# Patient Record
Sex: Female | Born: 1965 | Race: White | Hispanic: No | Smoking: Current every day smoker
Health system: Southern US, Community
[De-identification: ages and names within clinical notes are randomized; demographics above are authoritative.]

## PROBLEM LIST (undated history)

## (undated) DIAGNOSIS — G8929 Other chronic pain: Secondary | ICD-10-CM

## (undated) DIAGNOSIS — J449 Chronic obstructive pulmonary disease, unspecified: Secondary | ICD-10-CM

## (undated) DIAGNOSIS — R569 Unspecified convulsions: Secondary | ICD-10-CM

## (undated) DIAGNOSIS — D649 Anemia, unspecified: Secondary | ICD-10-CM

## (undated) DIAGNOSIS — E039 Hypothyroidism, unspecified: Secondary | ICD-10-CM

## (undated) DIAGNOSIS — R51 Headache: Secondary | ICD-10-CM

## (undated) HISTORY — DX: Unspecified convulsions: R56.9

## (undated) HISTORY — DX: Hypothyroidism, unspecified: E03.9

## (undated) HISTORY — DX: Chronic obstructive pulmonary disease, unspecified: J44.9

## (undated) HISTORY — DX: Other chronic pain: G89.29

## (undated) HISTORY — DX: Anemia, unspecified: D64.9

## (undated) HISTORY — PX: NASAL SINUS SURGERY: SHX719

## (undated) HISTORY — DX: Headache: R51

---

## 2006-02-22 ENCOUNTER — Emergency Department: Payer: Self-pay | Admitting: Unknown Physician Specialty

## 2006-02-23 ENCOUNTER — Emergency Department: Payer: Self-pay | Admitting: Emergency Medicine

## 2006-08-04 ENCOUNTER — Ambulatory Visit: Payer: Self-pay | Admitting: Pain Medicine

## 2006-08-24 ENCOUNTER — Emergency Department: Payer: Self-pay | Admitting: Emergency Medicine

## 2006-10-15 ENCOUNTER — Other Ambulatory Visit: Payer: Self-pay

## 2006-10-15 ENCOUNTER — Inpatient Hospital Stay: Payer: Self-pay | Admitting: Internal Medicine

## 2006-10-26 ENCOUNTER — Emergency Department: Payer: Self-pay | Admitting: Emergency Medicine

## 2007-09-12 ENCOUNTER — Emergency Department: Payer: Self-pay | Admitting: Emergency Medicine

## 2008-10-04 ENCOUNTER — Emergency Department: Payer: Self-pay | Admitting: Emergency Medicine

## 2009-09-05 ENCOUNTER — Emergency Department: Payer: Self-pay | Admitting: Emergency Medicine

## 2009-11-11 ENCOUNTER — Inpatient Hospital Stay: Payer: Self-pay | Admitting: Internal Medicine

## 2009-11-28 ENCOUNTER — Inpatient Hospital Stay: Payer: Self-pay | Admitting: Student

## 2010-01-09 ENCOUNTER — Emergency Department: Payer: Self-pay | Admitting: Emergency Medicine

## 2010-01-14 ENCOUNTER — Inpatient Hospital Stay: Payer: Self-pay | Admitting: Surgery

## 2010-05-19 ENCOUNTER — Emergency Department: Payer: Self-pay | Admitting: Internal Medicine

## 2010-07-21 ENCOUNTER — Inpatient Hospital Stay: Payer: Self-pay | Admitting: Internal Medicine

## 2010-08-06 ENCOUNTER — Inpatient Hospital Stay: Payer: Self-pay | Admitting: Psychiatry

## 2011-10-28 IMAGING — CR DG CHEST 1V PORT
1 series · 1 of 1 positions shown · non-contrast
Comparison: none

REASON FOR EXAM: weakness
COMMENTS:

PROCEDURE:     DXR - DXR PORTABLE CHEST SINGLE VIEW  - November 28, 2009  [DATE]
RESULT:     Comparison is made to the prior exam of 11/11/2009. The lung
fields are clear. The heart, mediastinal and osseous structures show no
acute changes.

[view not recorded]
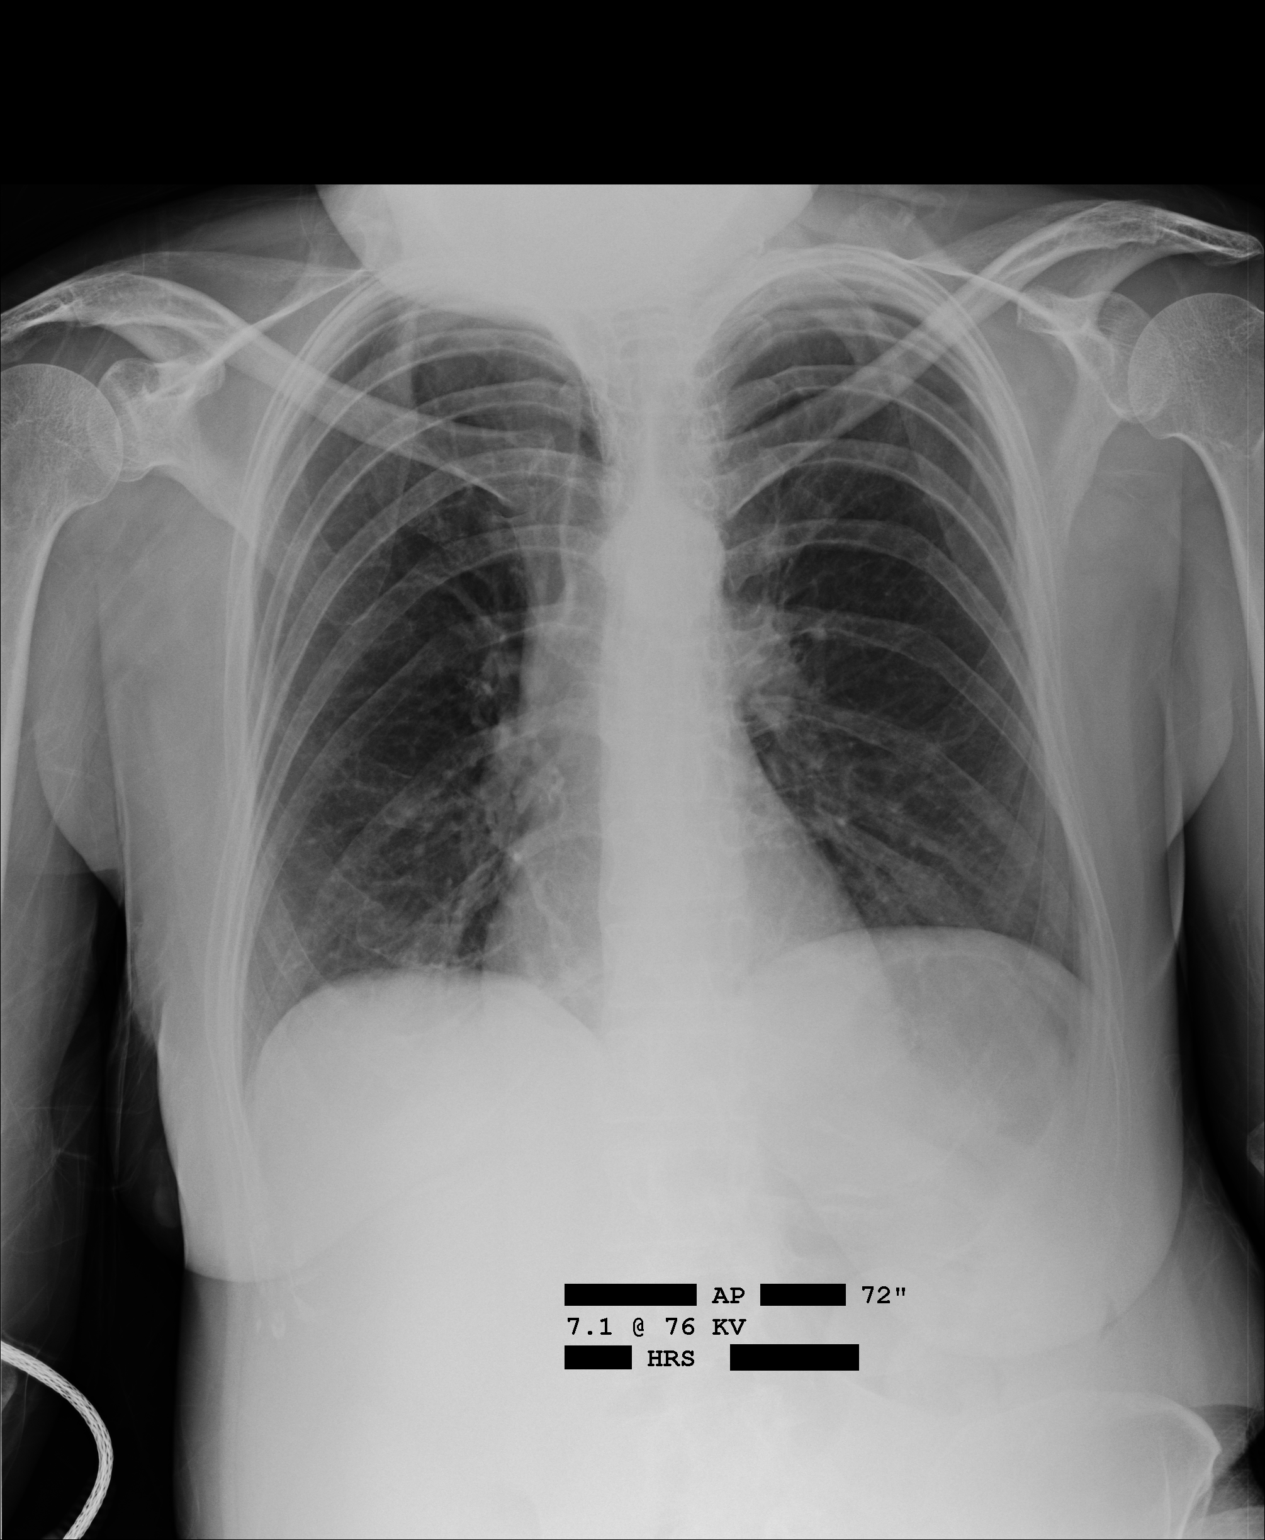

[1 of 1 positions shown; findings below may reference images not displayed]

IMPRESSION: 1.     No acute changes are identified.

## 2011-10-28 IMAGING — US ABDOMEN ULTRASOUND
1 series · 17 of 25 positions shown · non-contrast
Comparison: none

REASON FOR EXAM: elevated lfts
COMMENTS:

[Series 1: abdomen ultrasound · 17 of 55 slices shown]
[im 1/55]
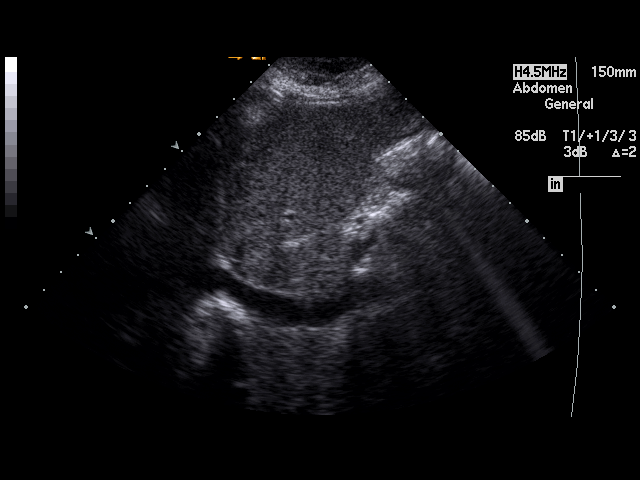
[im 5/55]
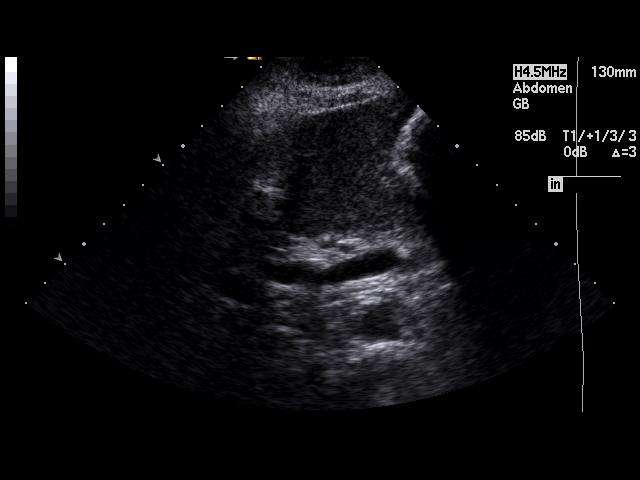
[im 7/55]
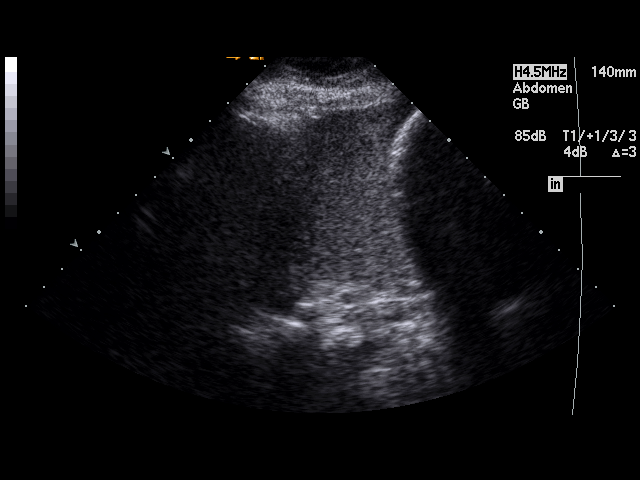
[im 12/55]
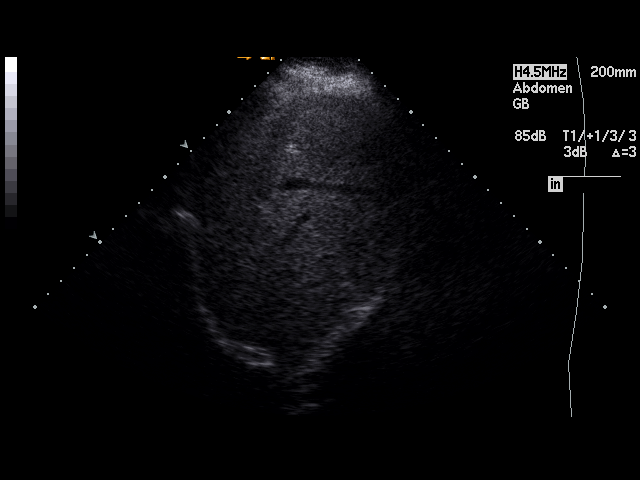
[im 14/55]
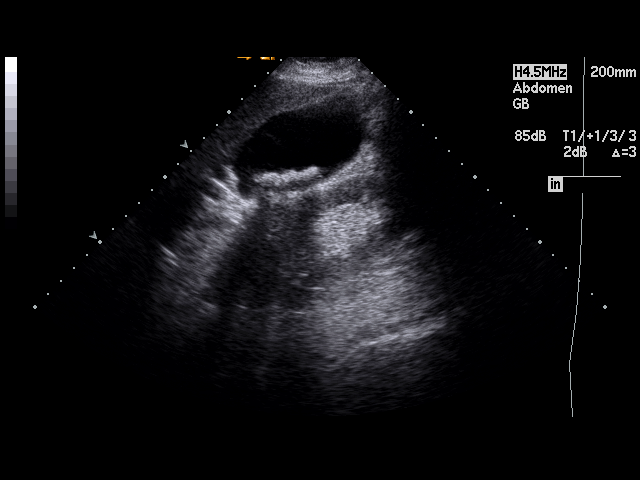
[im 19/55]
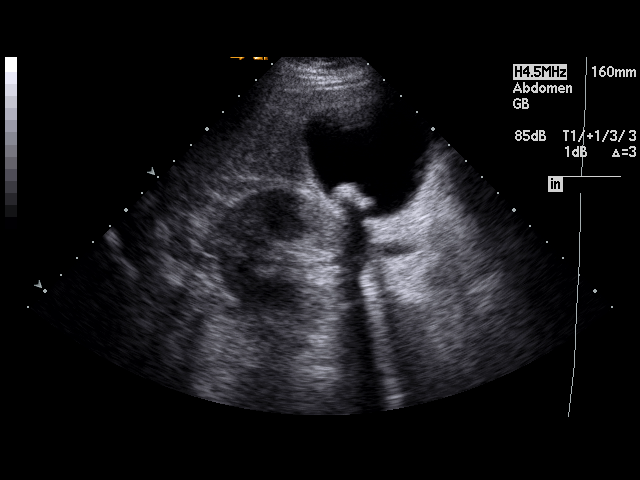
[im 21/55]
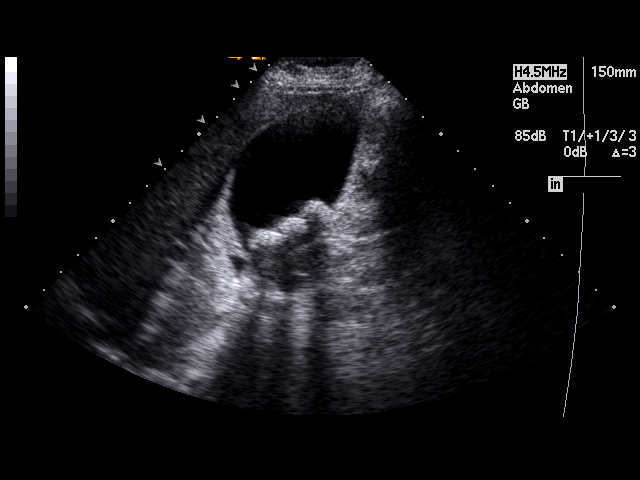
[im 25/55]
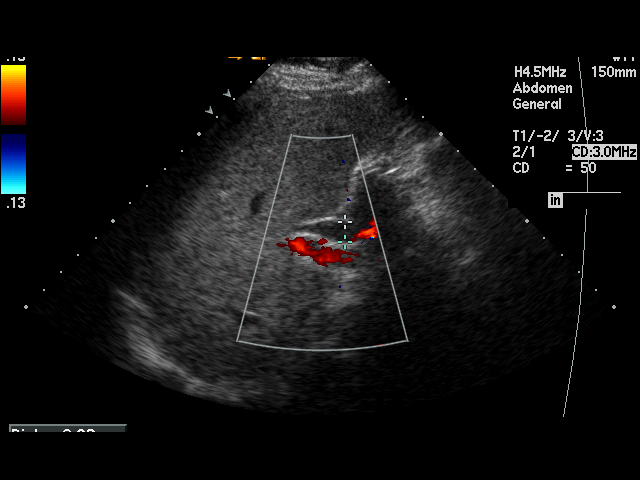
[im 28/55]
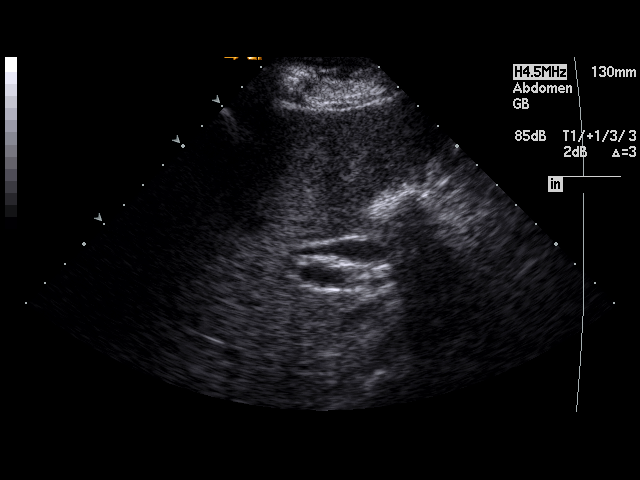
[im 30/55]
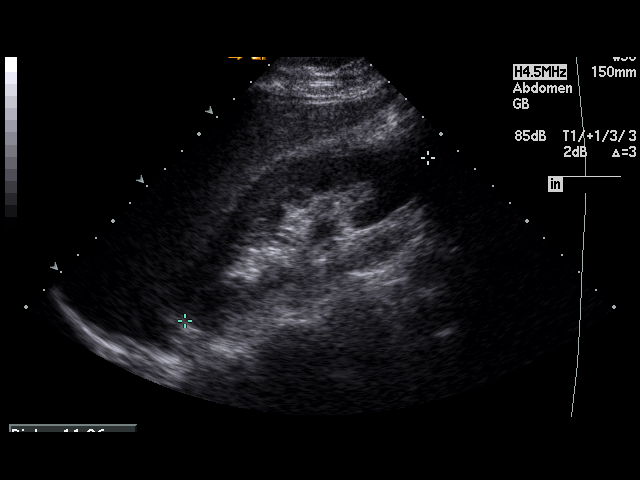
[im 34/55]
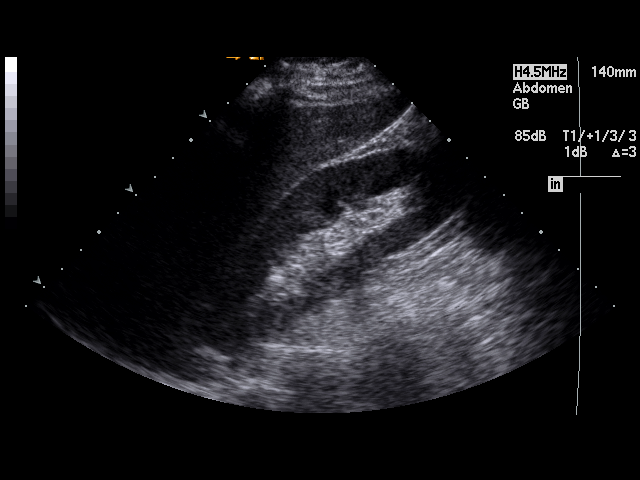
[im 37/55]
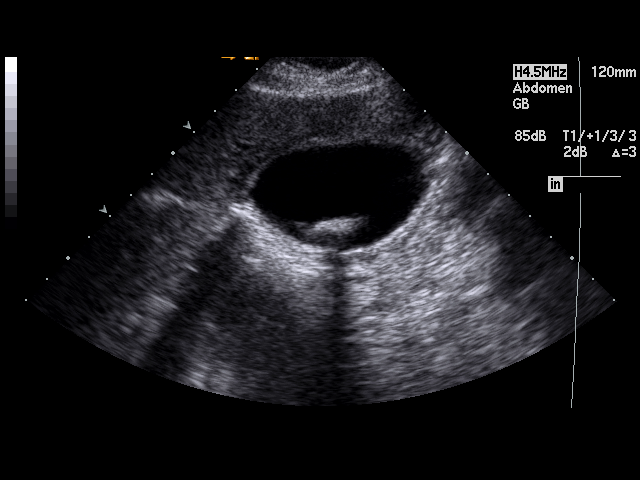
[im 41/55]
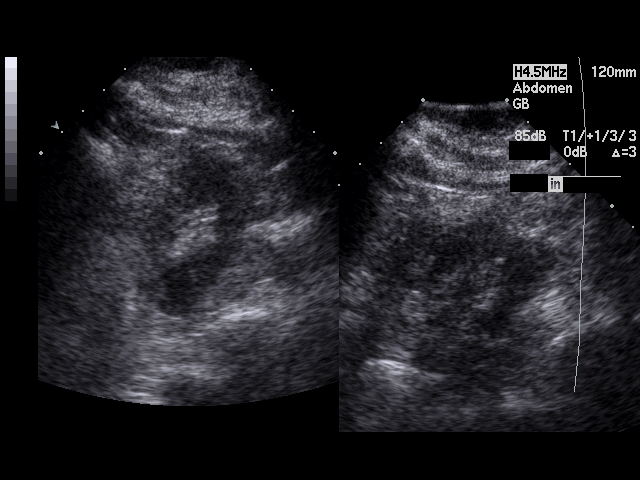
[im 43/55]
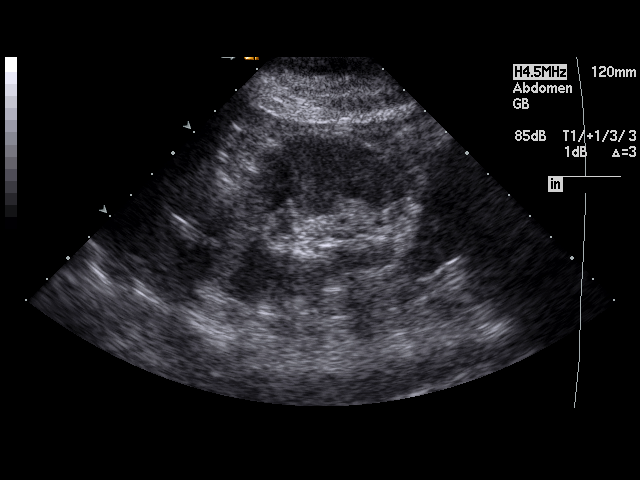
[im 48/55]
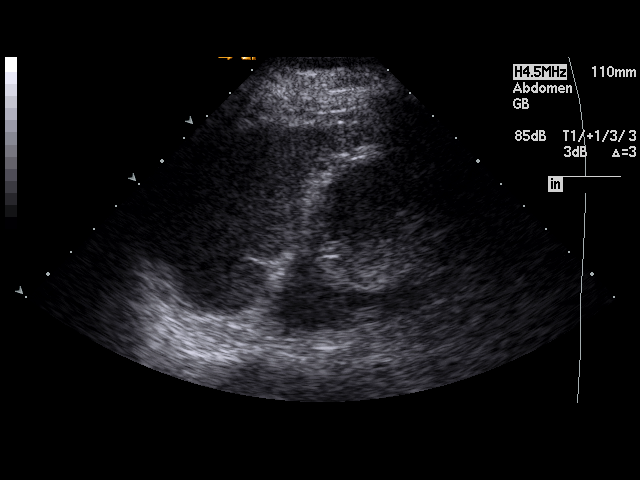
[im 50/55]
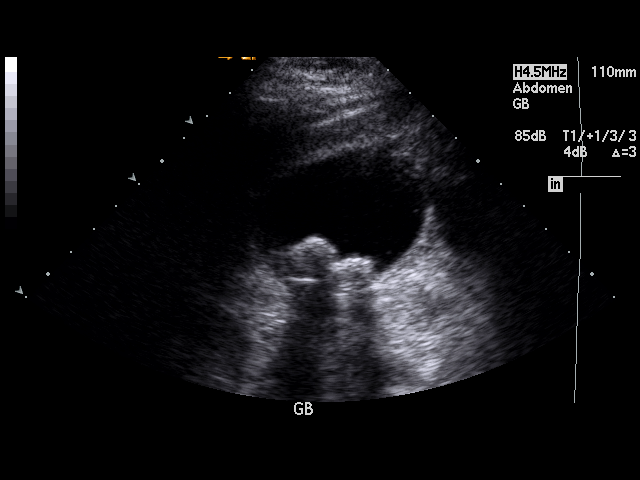
[im 55/55]
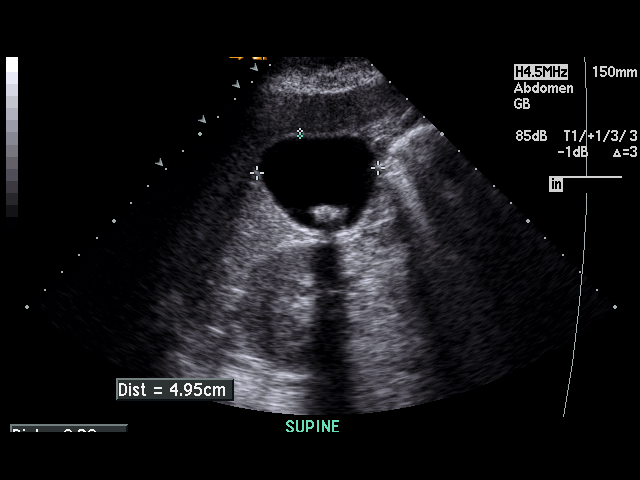

[17 of 25 positions shown; findings below may reference images not displayed]

PROCEDURE:     US  - US ABDOMEN GENERAL SURVEY  - November 28, 2009  [DATE]

RESULT:     Emergent CT of the abdomen demonstrates poor visualization of
the pancreas. Cholelithiasis is present with at least 3 stones seen. The
gallbladder is distended. The common bile duct diameter is 10.4 mm. The
gallbladder wall thickness is 2.0 mm. There is no sonographic Murphy's sign.
The stones demonstrated appear to be mobile. The liver, portal venous flow,
kidneys, spleen and aorta as well as the inferior vena cava appear to be
within normal limits.
IMPRESSION: Cholelithiasis. There is no intrahepatic ductal dilation
the common bile duct is over 10 mm in diameter. The gallbladder is
distended. Correlate with clinical and laboratory findings. Poor
visualization of the pancreas.

## 2011-10-30 IMAGING — CT CT ABD-PELV W/ CM
1 of 2 series · 15 of 32 positions shown, 19 images · non-contrast
Comparison: none

REASON FOR EXAM: (1) nausea, vomiting; (2) nausea vomiting
COMMENTS:

PROCEDURE:     CT  - CT ABDOMEN / PELVIS  W  - November 30, 2009  [DATE]
RESULT:     History: nausea and vomiting.
Comparison Study: CT of 10/17/2006

[Series 2: abdomen · axial · 0.59mm/px · z∈[-414,-54]mm · 15 of 80 slices shown, 19 images]
[im 4/80  soft-tissue]
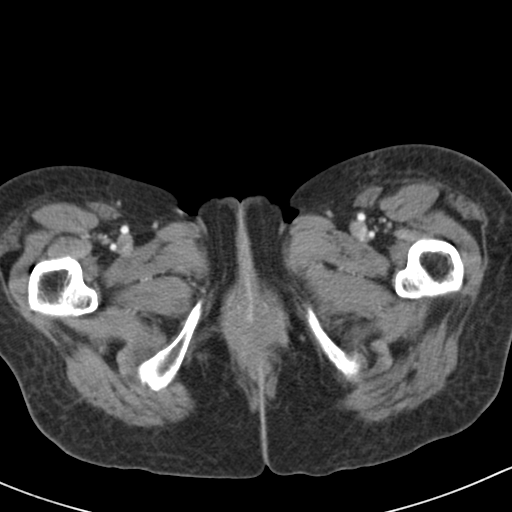
[im 4/80  bone]
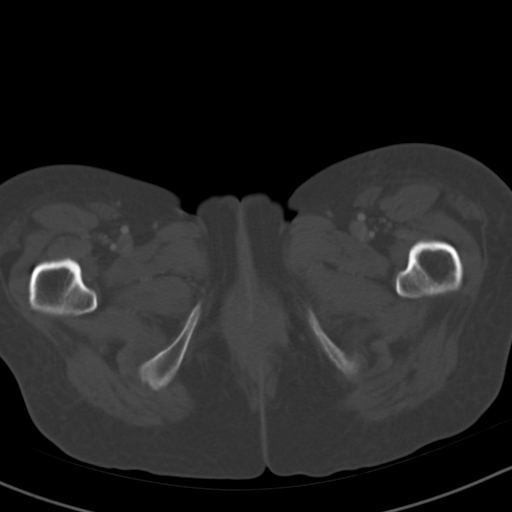
[im 10/80  soft-tissue]
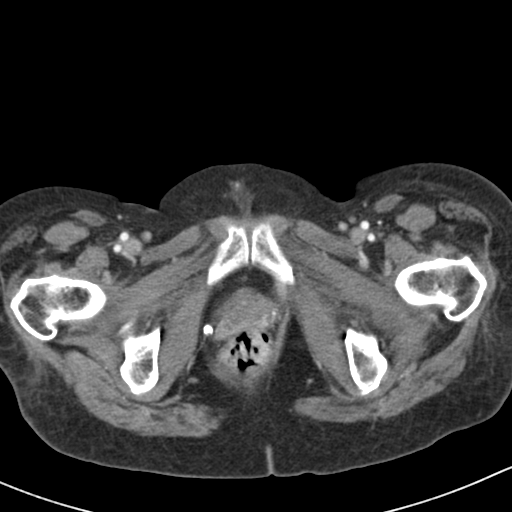
[im 17/80  soft-tissue]
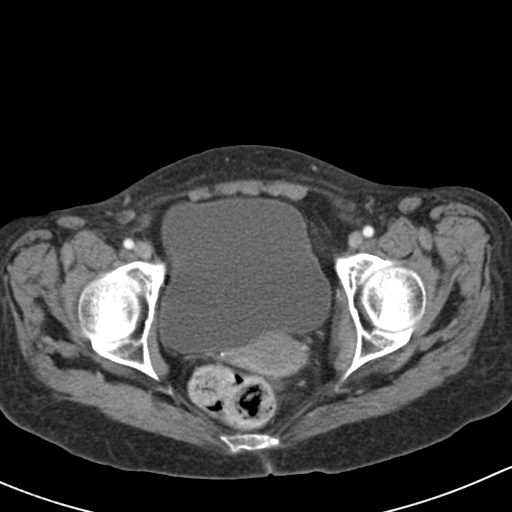
[im 24/80  soft-tissue]
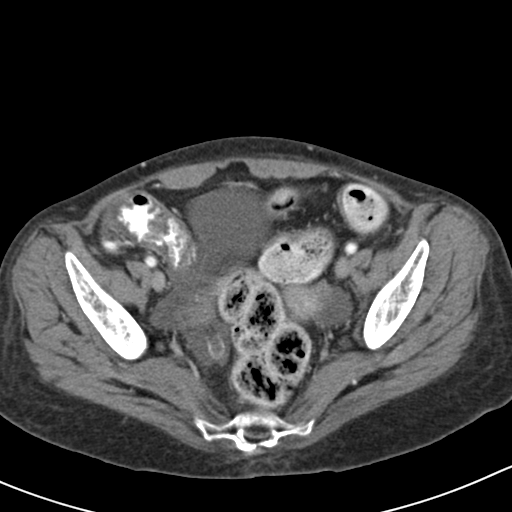
[im 27/80  soft-tissue]
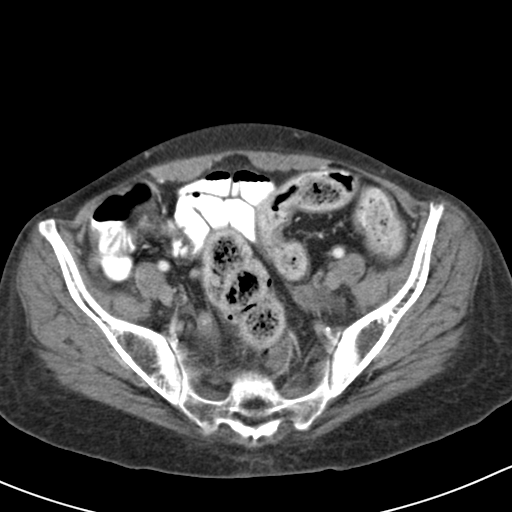
[im 33/80  soft-tissue]
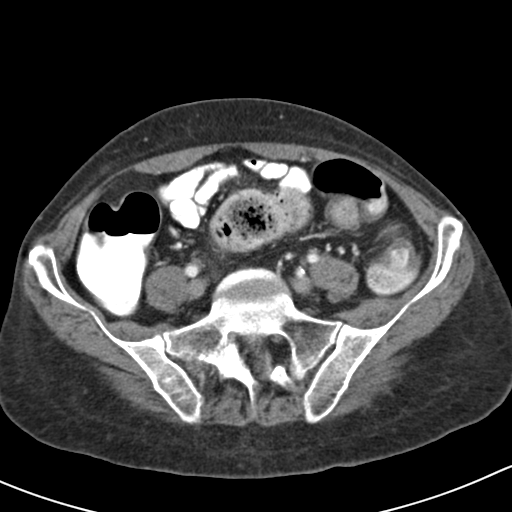
[im 40/80  soft-tissue]
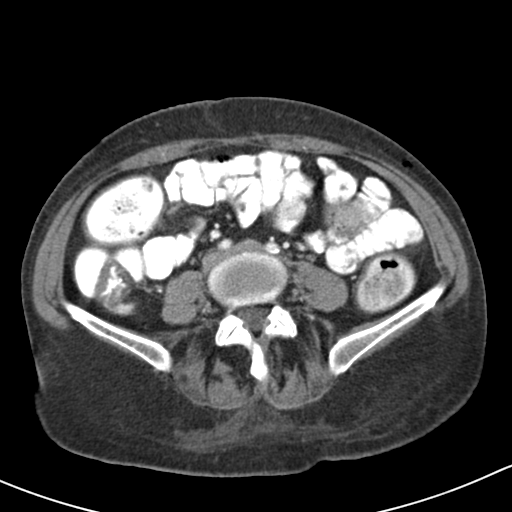
[im 47/80  soft-tissue]
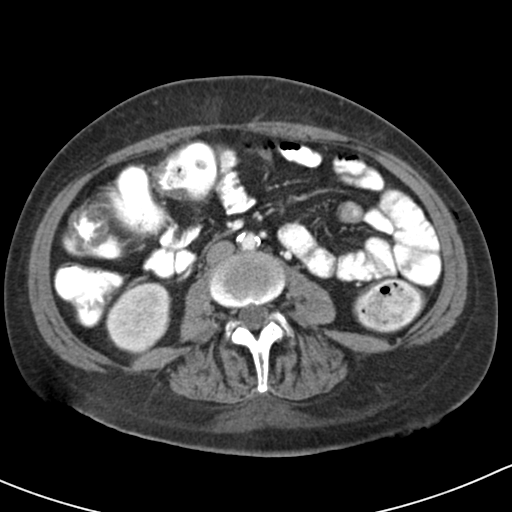
[im 53/80  soft-tissue]
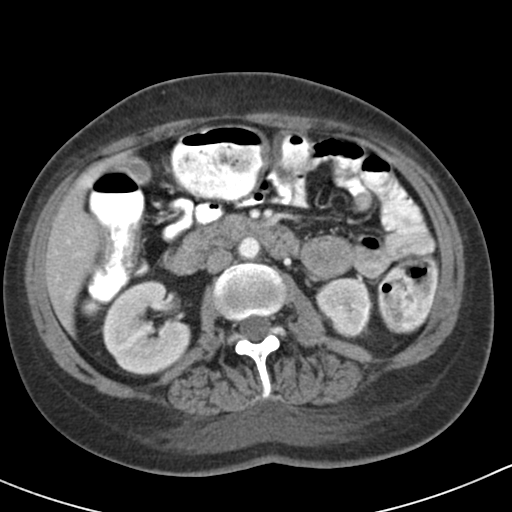
[im 53/80  bone]
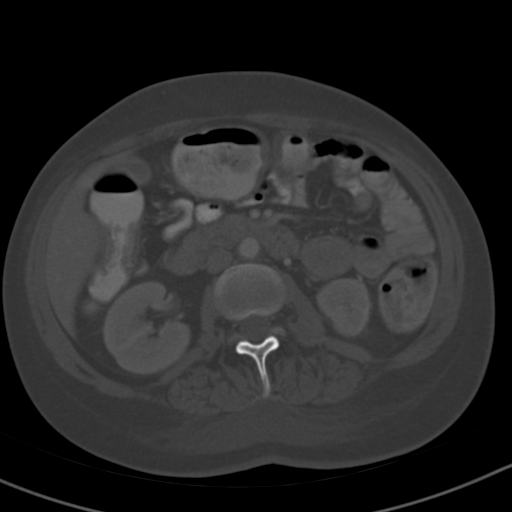
[im 56/80  soft-tissue]
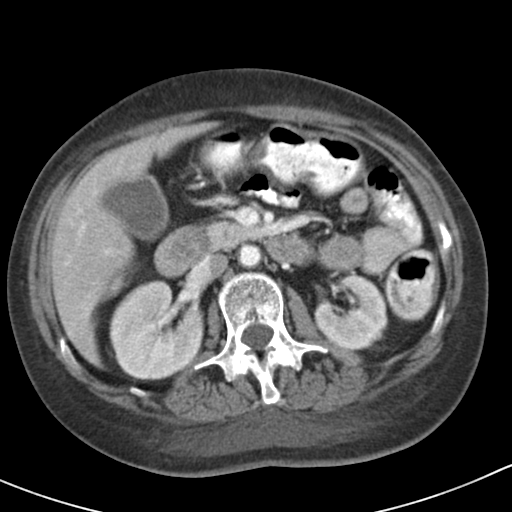
[im 63/80  soft-tissue]
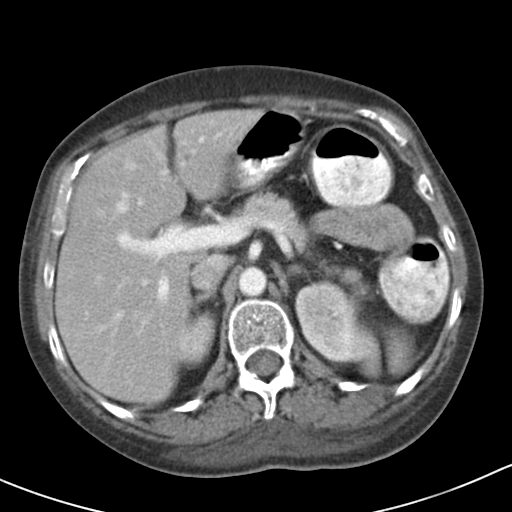
[im 66/80  lung]
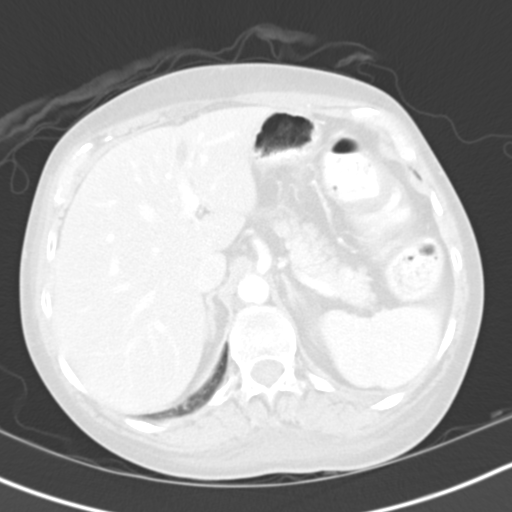
[im 70/80  soft-tissue]
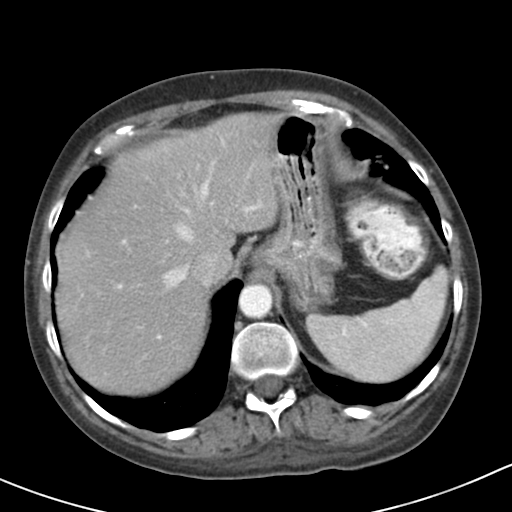
[im 70/80  lung]
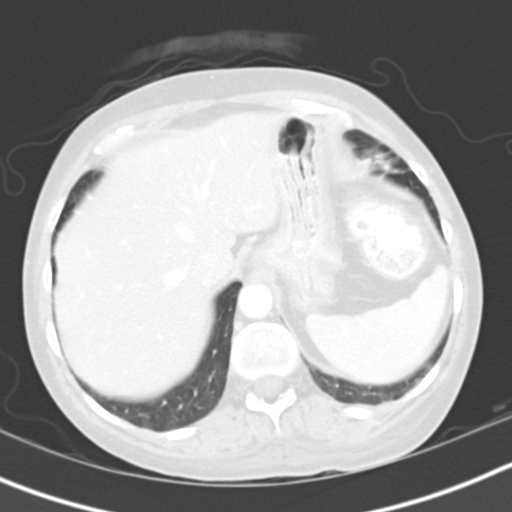
[im 73/80  lung]
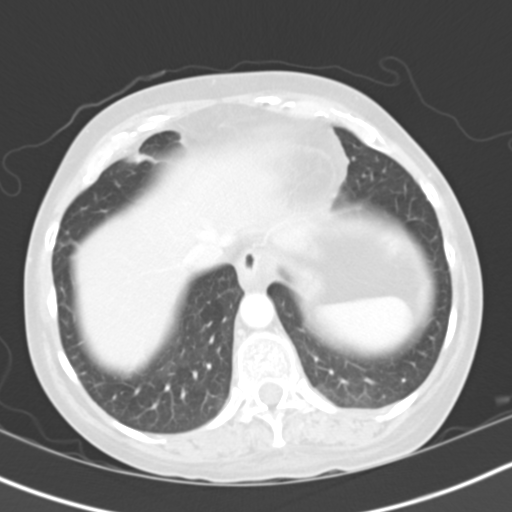
[im 76/80  soft-tissue]
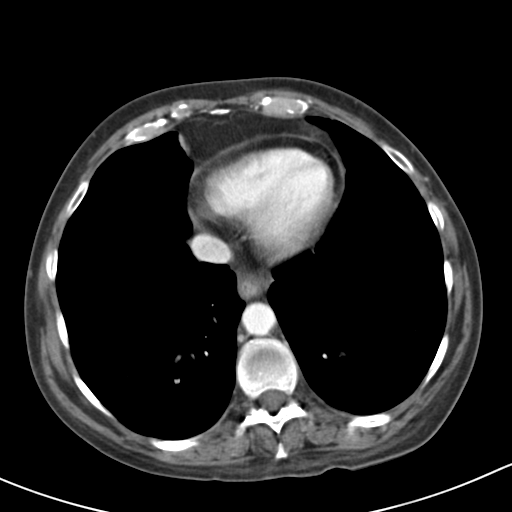
[im 76/80  lung]
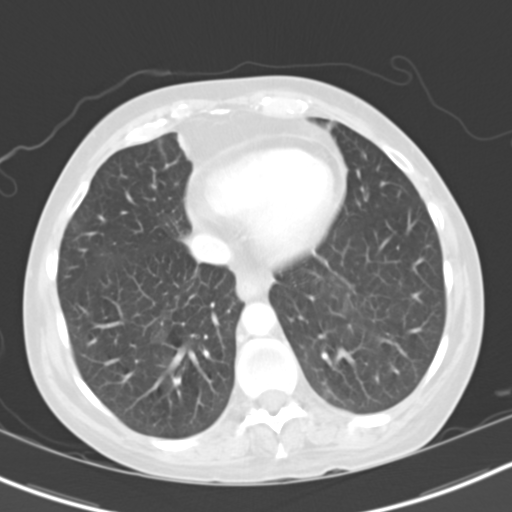

[15 of 32 positions shown; findings below may reference images not displayed]

FINDINGS: IV contrast enhanced CT the abdomen pelvis performed with 85 cc of
[id number].Liver normal. Gallbladder normal. Spleen normal. Pancreas normal.
Adrenals normal. Kidneys normal. Pancreas normal. No bowel distention. The
appendix is not definitely identified. This examination was evaluated with
SYNGO webspace. Small amount of fluid is noted right lower quadrant. Bladder
diverticula are noted. Bladder otherwise unremarkable. There is a tubular
structure in the right lower pelvis. This appears to be well away from the
cecum. Although appendicitis cannot be excluded this could be distended
fallopian tube and the possibility of a process such as PID or ectopic
pregnancy cannot be excluded. A thrombosed ovarian vein could present this
fashion. Pelvic ultrasound may prove useful for further evaluation. Adnexal
cysts are present. No bowel obstruction noted. No free air. Basilar
atelectasis.
IMPRESSION: Fluid-filled tubular structure measuring approximately 8 mm
in diameter with adjacent mild fluid in the right lower pelvis with
differential diagnosis above. Small amount of free fluid in the right lower
quadrant. Pelvic ultrasound may prove useful for further evaluation.
2. Liver and gallbladder normal by CT. No biliary distention. Pancreas is
normal.
3. Incidental note is made of small inguinal lymph nodes. No retroperitoneal
adenopathy noted. Report phoned to patient's physician at time of study.

## 2012-01-01 ENCOUNTER — Inpatient Hospital Stay: Payer: Self-pay | Admitting: Internal Medicine

## 2012-01-01 LAB — RAPID URINE DRUG SCREEN, HOSP PERFORMED
Amphetamines, Ur Screen: NEGATIVE (ref ?–1000)
Cocaine Metabolite,Ur ~~LOC~~: NEGATIVE (ref ?–300)
Opiate, Ur Screen: NEGATIVE (ref ?–300)

## 2012-01-01 LAB — URINALYSIS, COMPLETE
Bacteria: NONE SEEN
Glucose,UR: NEGATIVE mg/dL (ref 0–75)
Hyaline Cast: 1
Ketone: NEGATIVE
Nitrite: NEGATIVE
Specific Gravity: 1.021 (ref 1.003–1.030)

## 2012-01-01 LAB — CBC
HCT: 47.4 % — ABNORMAL HIGH (ref 35.0–47.0)
HGB: 15.7 g/dL (ref 12.0–16.0)
MCH: 32.7 pg (ref 26.0–34.0)
MCV: 99 fL (ref 80–100)
Platelet: 292 10*3/uL (ref 150–440)
RBC: 4.81 10*6/uL (ref 3.80–5.20)
WBC: 17.8 10*3/uL — ABNORMAL HIGH (ref 3.6–11.0)

## 2012-01-01 LAB — BASIC METABOLIC PANEL
BUN: 4 mg/dL — ABNORMAL LOW (ref 7–18)
Chloride: 97 mmol/L — ABNORMAL LOW (ref 98–107)
Co2: 25 mmol/L (ref 21–32)
Creatinine: 1.04 mg/dL (ref 0.60–1.30)
EGFR (African American): >60
Potassium: 3.1 mmol/L — ABNORMAL LOW (ref 3.5–5.1)
Sodium: 135 mmol/L — ABNORMAL LOW (ref 136–145)

## 2012-01-01 LAB — TROPONIN I: Troponin-I: <0.02 ng/mL

## 2012-01-01 LAB — POTASSIUM: Potassium: 4 mmol/L (ref 3.5–5.1)

## 2012-01-01 LAB — CK TOTAL AND CKMB (NOT AT ARMC)
CK, Total: 151 U/L (ref 21–215)
CK-MB: 5.1 ng/mL — ABNORMAL HIGH (ref 0.5–3.6)
CK-MB: 3 ng/mL (ref 0.5–3.6)

## 2012-01-01 LAB — RAPID INFLUENZA A&B ANTIGENS

## 2012-01-02 LAB — CBC WITH DIFFERENTIAL/PLATELET
Basophil %: 0 %
Eosinophil %: 0 %
HCT: 42 % (ref 35.0–47.0)
HGB: 13.9 g/dL (ref 12.0–16.0)
Lymphocyte #: 1.1 10*3/uL (ref 1.0–3.6)
Lymphocyte %: 7.3 %
MCH: 32.7 pg (ref 26.0–34.0)
MCV: 99 fL (ref 80–100)
Monocyte #: 0.4 10*3/uL (ref 0.0–0.7)
Monocyte %: 2.6 %
Platelet: 243 10*3/uL (ref 150–440)
RBC: 4.25 10*6/uL (ref 3.80–5.20)
WBC: 14.9 10*3/uL — ABNORMAL HIGH (ref 3.6–11.0)

## 2012-01-02 LAB — BASIC METABOLIC PANEL
Anion Gap: 12 (ref 7–16)
Anion Gap: 10 (ref 7–16)
BUN: 13 mg/dL (ref 7–18)
BUN: 15 mg/dL (ref 7–18)
Calcium, Total: 8.2 mg/dL — ABNORMAL LOW (ref 8.5–10.1)
Co2: 22 mmol/L (ref 21–32)
Co2: 23 mmol/L (ref 21–32)
Creatinine: 1.12 mg/dL (ref 0.60–1.30)
EGFR (African American): >60
EGFR (African American): 56 — ABNORMAL LOW
EGFR (Non-African Amer.): 56 — ABNORMAL LOW
EGFR (Non-African Amer.): 46 — ABNORMAL LOW
Glucose: 159 mg/dL — ABNORMAL HIGH (ref 65–99)
Osmolality: 281 (ref 275–301)
Osmolality: 282 (ref 275–301)
Sodium: 139 mmol/L (ref 136–145)
Sodium: 139 mmol/L (ref 136–145)

## 2012-01-02 LAB — HEPATIC FUNCTION PANEL A (ARMC)
Albumin: 2.5 g/dL — ABNORMAL LOW (ref 3.4–5.0)
Alkaline Phosphatase: 92 U/L (ref 50–136)
Bilirubin,Total: 0.2 mg/dL (ref 0.2–1.0)
SGOT(AST): 20 U/L (ref 15–37)
SGPT (ALT): 17 U/L

## 2012-01-02 LAB — PHOSPHORUS
Phosphorus: 0.8 mg/dL — CL (ref 2.5–4.9)
Phosphorus: 4.2 mg/dL (ref 2.5–4.9)

## 2012-01-02 LAB — CK TOTAL AND CKMB (NOT AT ARMC)
CK, Total: 74 U/L (ref 21–215)
CK-MB: 2 ng/mL (ref 0.5–3.6)
CK-MB: 1.8 ng/mL (ref 0.5–3.6)

## 2012-01-02 LAB — TSH: Thyroid Stimulating Horm: 5.34 u[IU]/mL — ABNORMAL HIGH

## 2012-01-02 LAB — MAGNESIUM: Magnesium: 1.7 mg/dL — ABNORMAL LOW

## 2012-01-02 LAB — AMMONIA: Ammonia, Plasma: 89 mcmol/L — ABNORMAL HIGH (ref 11–32)

## 2012-01-03 LAB — BASIC METABOLIC PANEL
Calcium, Total: 7.6 mg/dL — ABNORMAL LOW (ref 8.5–10.1)
Co2: 23 mmol/L (ref 21–32)
Creatinine: 0.96 mg/dL (ref 0.60–1.30)
EGFR (African American): >60
EGFR (Non-African Amer.): >60
Glucose: 132 mg/dL — ABNORMAL HIGH (ref 65–99)
Osmolality: 290 (ref 275–301)
Potassium: 3.6 mmol/L (ref 3.5–5.1)

## 2012-01-03 LAB — CBC WITH DIFFERENTIAL/PLATELET
Basophil #: 0 10*3/uL (ref 0.0–0.1)
Basophil %: 0 %
Eosinophil %: 0 %
HCT: 38.2 % (ref 35.0–47.0)
HGB: 12.8 g/dL (ref 12.0–16.0)
Lymphocyte #: 1.3 10*3/uL (ref 1.0–3.6)
Lymphocyte %: 6.3 %
MCH: 33.2 pg (ref 26.0–34.0)
MCV: 99 fL (ref 80–100)
Monocyte #: 1 10*3/uL — ABNORMAL HIGH (ref 0.0–0.7)
Monocyte %: 5 %
Neutrophil #: 17.7 10*3/uL — ABNORMAL HIGH (ref 1.4–6.5)
Platelet: 275 10*3/uL (ref 150–440)
RDW: 15.8 % — ABNORMAL HIGH (ref 11.5–14.5)
WBC: 20 10*3/uL — ABNORMAL HIGH (ref 3.6–11.0)

## 2012-01-03 LAB — PHENYTOIN LEVEL, TOTAL: Dilantin: 12.9 ug/mL (ref 10.0–20.0)

## 2012-01-04 LAB — CBC WITH DIFFERENTIAL/PLATELET
Basophil #: 0 10*3/uL (ref 0.0–0.1)
Lymphocyte #: 1.4 10*3/uL (ref 1.0–3.6)
Lymphocyte %: 6.3 %
MCV: 99 fL (ref 80–100)
Monocyte %: 5.7 %
Platelet: 268 10*3/uL (ref 150–440)
RDW: 15.8 % — ABNORMAL HIGH (ref 11.5–14.5)
WBC: 21.6 10*3/uL — ABNORMAL HIGH (ref 3.6–11.0)

## 2012-01-04 LAB — BASIC METABOLIC PANEL
BUN: 14 mg/dL (ref 7–18)
Chloride: 111 mmol/L — ABNORMAL HIGH (ref 98–107)
Creatinine: 0.87 mg/dL (ref 0.60–1.30)
EGFR (African American): >60
EGFR (Non-African Amer.): >60
Glucose: 157 mg/dL — ABNORMAL HIGH (ref 65–99)
Sodium: 146 mmol/L — ABNORMAL HIGH (ref 136–145)

## 2012-01-04 LAB — MAGNESIUM: Magnesium: 2.4 mg/dL

## 2012-01-04 LAB — POTASSIUM: Potassium: 3.5 mmol/L (ref 3.5–5.1)

## 2012-01-04 LAB — PHOSPHORUS: Phosphorus: 1.5 mg/dL — ABNORMAL LOW (ref 2.5–4.9)

## 2012-01-05 LAB — CBC WITH DIFFERENTIAL/PLATELET
Basophil %: 0 %
Eosinophil #: 0 10*3/uL (ref 0.0–0.7)
Eosinophil %: 0 %
HCT: 40.1 % (ref 35.0–47.0)
HGB: 13.3 g/dL (ref 12.0–16.0)
Lymphocyte %: 6.9 %
MCH: 33 pg (ref 26.0–34.0)
MCHC: 33.1 g/dL (ref 32.0–36.0)
MCV: 100 fL (ref 80–100)
Neutrophil #: 19.2 10*3/uL — ABNORMAL HIGH (ref 1.4–6.5)

## 2012-01-05 LAB — BASIC METABOLIC PANEL
BUN: 15 mg/dL (ref 7–18)
Chloride: 111 mmol/L — ABNORMAL HIGH (ref 98–107)
Co2: 24 mmol/L (ref 21–32)
Creatinine: 0.76 mg/dL (ref 0.60–1.30)
Osmolality: 290 (ref 275–301)
Potassium: 3.9 mmol/L (ref 3.5–5.1)

## 2012-01-05 LAB — AMMONIA: Ammonia, Plasma: 76 mcmol/L — ABNORMAL HIGH (ref 11–32)

## 2012-01-05 LAB — PHOSPHORUS: Phosphorus: 2 mg/dL — ABNORMAL LOW (ref 2.5–4.9)

## 2012-01-06 LAB — CBC WITH DIFFERENTIAL/PLATELET
HCT: 41.6 % (ref 35.0–47.0)
HGB: 13.8 g/dL (ref 12.0–16.0)
Lymphocytes: 8 %
MCH: 33.1 pg (ref 26.0–34.0)
MCV: 99 fL (ref 80–100)
Metamyelocyte: 6 %
Myelocyte: 3 %
Promyelocyte: 2 %
RBC: 4.19 10*6/uL (ref 3.80–5.20)
Variant Lymphocyte - H1-Rlymph: 1 %
WBC: 25.7 10*3/uL — ABNORMAL HIGH (ref 3.6–11.0)

## 2012-01-06 LAB — BASIC METABOLIC PANEL
Anion Gap: 11 (ref 7–16)
BUN: 13 mg/dL (ref 7–18)
Calcium, Total: 7.6 mg/dL — ABNORMAL LOW (ref 8.5–10.1)
Chloride: 107 mmol/L (ref 98–107)
Co2: 25 mmol/L (ref 21–32)
Creatinine: 0.77 mg/dL (ref 0.60–1.30)
EGFR (African American): >60
EGFR (Non-African Amer.): >60
Glucose: 167 mg/dL — ABNORMAL HIGH (ref 65–99)
Osmolality: 289 (ref 275–301)
Sodium: 143 mmol/L (ref 136–145)

## 2012-01-06 LAB — AMMONIA: Ammonia, Plasma: 66 mcmol/L — ABNORMAL HIGH (ref 11–32)

## 2012-01-06 LAB — POTASSIUM: Potassium: 3.4 mmol/L — ABNORMAL LOW (ref 3.5–5.1)

## 2012-01-06 LAB — CULTURE, BLOOD (SINGLE)

## 2012-01-06 LAB — PHOSPHORUS: Phosphorus: 1.3 mg/dL — ABNORMAL LOW (ref 2.5–4.9)

## 2012-01-07 LAB — CBC WITH DIFFERENTIAL/PLATELET
HCT: 42.7 % (ref 35.0–47.0)
Lymphocytes: 8 %
MCH: 32.9 pg (ref 26.0–34.0)
MCV: 99 fL (ref 80–100)
Metamyelocyte: 4 %
Myelocyte: 2 %
RBC: 4.33 10*6/uL (ref 3.80–5.20)
RDW: 16.5 % — ABNORMAL HIGH (ref 11.5–14.5)
Variant Lymphocyte - H1-Rlymph: 7 %
WBC: 26.9 10*3/uL — ABNORMAL HIGH (ref 3.6–11.0)

## 2012-01-07 LAB — BASIC METABOLIC PANEL
Anion Gap: 11 (ref 7–16)
BUN: 10 mg/dL (ref 7–18)
Chloride: 104 mmol/L (ref 98–107)
Co2: 26 mmol/L (ref 21–32)
Glucose: 131 mg/dL — ABNORMAL HIGH (ref 65–99)
Osmolality: 282 (ref 275–301)
Potassium: 3.9 mmol/L (ref 3.5–5.1)
Sodium: 141 mmol/L (ref 136–145)

## 2012-01-07 LAB — AMMONIA: Ammonia, Plasma: 57 mcmol/L — ABNORMAL HIGH (ref 11–32)

## 2012-01-08 LAB — CBC WITH DIFFERENTIAL/PLATELET
Basophil #: 0.2 10*3/uL — ABNORMAL HIGH (ref 0.0–0.1)
Basophil %: 1 %
Eosinophil %: 0 %
HCT: 42.8 % (ref 35.0–47.0)
HGB: 14.2 g/dL (ref 12.0–16.0)
Lymphocyte %: 5.1 %
Lymphocytes: 2 %
MCHC: 33.1 g/dL (ref 32.0–36.0)
MCV: 99 fL (ref 80–100)
Monocyte #: 0.7 10*3/uL (ref 0.0–0.7)
Monocyte %: 2.9 %
Monocytes: 4 %
Myelocyte: 1 %
NRBC/100 WBC: 0 /
Neutrophil %: 91 %
Platelet: 271 10*3/uL (ref 150–440)
Segmented Neutrophils: 89 %
Variant Lymphocyte - H1-Rlymph: 4 %
WBC: 24.9 10*3/uL — ABNORMAL HIGH (ref 3.6–11.0)

## 2012-01-08 LAB — BASIC METABOLIC PANEL
BUN: 13 mg/dL (ref 7–18)
Chloride: 102 mmol/L (ref 98–107)
Co2: 27 mmol/L (ref 21–32)
Creatinine: 0.66 mg/dL (ref 0.60–1.30)
Osmolality: 281 (ref 275–301)
Potassium: 3.8 mmol/L (ref 3.5–5.1)

## 2012-01-08 LAB — PHOSPHORUS: Phosphorus: 1.8 mg/dL — ABNORMAL LOW (ref 2.5–4.9)

## 2012-01-09 LAB — CBC WITH DIFFERENTIAL/PLATELET
Basophil #: 0.1 10*3/uL (ref 0.0–0.1)
Basophil %: 0.3 %
Eosinophil #: 0 10*3/uL (ref 0.0–0.7)
Lymphocyte #: 1.4 10*3/uL (ref 1.0–3.6)
MCH: 32.7 pg (ref 26.0–34.0)
MCV: 98 fL (ref 80–100)
Monocyte #: 0.4 10*3/uL (ref 0.0–0.7)
Platelet: 239 10*3/uL (ref 150–440)
RDW: 16.5 % — ABNORMAL HIGH (ref 11.5–14.5)
WBC: 25.9 10*3/uL — ABNORMAL HIGH (ref 3.6–11.0)

## 2012-01-09 LAB — BASIC METABOLIC PANEL
Anion Gap: 10 (ref 7–16)
Calcium, Total: 8 mg/dL — ABNORMAL LOW (ref 8.5–10.1)
Chloride: 106 mmol/L (ref 98–107)
Creatinine: 0.68 mg/dL (ref 0.60–1.30)
EGFR (African American): >60
Glucose: 95 mg/dL (ref 65–99)
Potassium: 3.2 mmol/L — ABNORMAL LOW (ref 3.5–5.1)
Sodium: 144 mmol/L (ref 136–145)

## 2012-01-09 LAB — AMMONIA: Ammonia, Plasma: 43 mcmol/L — ABNORMAL HIGH (ref 11–32)

## 2012-01-10 LAB — MAGNESIUM: Magnesium: 2.2 mg/dL

## 2012-01-10 LAB — CBC WITH DIFFERENTIAL/PLATELET
Basophil %: 0.9 %
Eosinophil #: 0 10*3/uL (ref 0.0–0.7)
Eosinophil %: 0 %
HCT: 42.3 % (ref 35.0–47.0)
HGB: 14 g/dL (ref 12.0–16.0)
Lymphocyte #: 1.4 10*3/uL (ref 1.0–3.6)
Lymphocyte %: 8 %
MCH: 32.4 pg (ref 26.0–34.0)
MCHC: 33 g/dL (ref 32.0–36.0)
MCV: 98 fL (ref 80–100)
Monocyte #: 0.5 10*3/uL (ref 0.0–0.7)
Monocyte %: 3.1 %
Neutrophil #: 15.4 10*3/uL — ABNORMAL HIGH (ref 1.4–6.5)
Neutrophil %: 88 %
RBC: 4.31 10*6/uL (ref 3.80–5.20)
RDW: 16.6 % — ABNORMAL HIGH (ref 11.5–14.5)

## 2012-01-10 LAB — BASIC METABOLIC PANEL
Anion Gap: 12 (ref 7–16)
BUN: 10 mg/dL (ref 7–18)
Chloride: 104 mmol/L (ref 98–107)
Co2: 28 mmol/L (ref 21–32)
Creatinine: 0.64 mg/dL (ref 0.60–1.30)
EGFR (African American): >60
Osmolality: 286 (ref 275–301)
Potassium: 3.2 mmol/L — ABNORMAL LOW (ref 3.5–5.1)

## 2012-01-11 LAB — BASIC METABOLIC PANEL
Anion Gap: 8 (ref 7–16)
BUN: 7 mg/dL (ref 7–18)
Calcium, Total: 8 mg/dL — ABNORMAL LOW (ref 8.5–10.1)
Chloride: 101 mmol/L (ref 98–107)
Co2: 30 mmol/L (ref 21–32)
EGFR (Non-African Amer.): >60
Glucose: 115 mg/dL — ABNORMAL HIGH (ref 65–99)
Osmolality: 276 (ref 275–301)
Potassium: 3.2 mmol/L — ABNORMAL LOW (ref 3.5–5.1)

## 2012-01-11 LAB — CBC WITH DIFFERENTIAL/PLATELET
Basophil #: 0 10*3/uL (ref 0.0–0.1)
Basophil %: 0.3 %
Eosinophil %: 0.1 %
HGB: 13.2 g/dL (ref 12.0–16.0)
MCH: 32.4 pg (ref 26.0–34.0)
Monocyte #: 0.5 10*3/uL (ref 0.0–0.7)
Monocyte %: 3.8 %
Neutrophil %: 84.9 %
Platelet: 191 10*3/uL (ref 150–440)
RDW: 16.7 % — ABNORMAL HIGH (ref 11.5–14.5)
WBC: 14.4 10*3/uL — ABNORMAL HIGH (ref 3.6–11.0)

## 2012-01-12 LAB — CBC WITH DIFFERENTIAL/PLATELET
Basophil %: 0.4 %
HCT: 41.5 % (ref 35.0–47.0)
HGB: 13.6 g/dL (ref 12.0–16.0)
Lymphocyte %: 25 %
MCHC: 32.8 g/dL (ref 32.0–36.0)
Monocyte %: 7 %
Neutrophil #: 9.5 10*3/uL — ABNORMAL HIGH (ref 1.4–6.5)
RBC: 4.17 10*6/uL (ref 3.80–5.20)
WBC: 14.1 10*3/uL — ABNORMAL HIGH (ref 3.6–11.0)

## 2012-01-12 LAB — POTASSIUM: Potassium: 3 mmol/L — ABNORMAL LOW (ref 3.5–5.1)

## 2013-06-06 ENCOUNTER — Inpatient Hospital Stay: Payer: Self-pay | Admitting: Internal Medicine

## 2013-06-06 LAB — URINALYSIS, COMPLETE
Bilirubin,UR: NEGATIVE
Glucose,UR: NEGATIVE mg/dL (ref 0–75)
Ph: 6 (ref 4.5–8.0)
Specific Gravity: 1.013 (ref 1.003–1.030)
WBC UR: 3 /HPF (ref 0–5)

## 2013-06-06 LAB — COMPREHENSIVE METABOLIC PANEL
Albumin: 2.7 g/dL — ABNORMAL LOW (ref 3.4–5.0)
BUN: 7 mg/dL (ref 7–18)
Bilirubin,Total: 0.4 mg/dL (ref 0.2–1.0)
Calcium, Total: 7.9 mg/dL — ABNORMAL LOW (ref 8.5–10.1)
Chloride: 96 mmol/L — ABNORMAL LOW (ref 98–107)
Co2: 28 mmol/L (ref 21–32)
Creatinine: 1.18 mg/dL (ref 0.60–1.30)
EGFR (African American): >60
Potassium: 2.6 mmol/L — ABNORMAL LOW (ref 3.5–5.1)
SGPT (ALT): 16 U/L (ref 12–78)

## 2013-06-06 LAB — MAGNESIUM: Magnesium: 1.6 mg/dL — ABNORMAL LOW

## 2013-06-06 LAB — DRUG SCREEN, URINE
Barbiturates, Ur Screen: NEGATIVE (ref ?–200)
Benzodiazepine, Ur Scrn: NEGATIVE (ref ?–200)
MDMA (Ecstasy)Ur Screen: NEGATIVE (ref ?–500)
Methadone, Ur Screen: NEGATIVE (ref ?–300)
Opiate, Ur Screen: NEGATIVE (ref ?–300)
Tricyclic, Ur Screen: NEGATIVE (ref ?–1000)

## 2013-06-06 LAB — CBC
HCT: 46.2 % (ref 35.0–47.0)
HGB: 15.1 g/dL (ref 12.0–16.0)
MCHC: 32.6 g/dL (ref 32.0–36.0)
Platelet: 204 10*3/uL (ref 150–440)
RBC: 5.36 10*6/uL — ABNORMAL HIGH (ref 3.80–5.20)

## 2013-06-07 LAB — COMPREHENSIVE METABOLIC PANEL
Alkaline Phosphatase: 75 U/L (ref 50–136)
Bilirubin,Total: 0.2 mg/dL (ref 0.2–1.0)
Calcium, Total: 7.1 mg/dL — ABNORMAL LOW (ref 8.5–10.1)
Chloride: 111 mmol/L — ABNORMAL HIGH (ref 98–107)
Co2: 23 mmol/L (ref 21–32)
EGFR (Non-African Amer.): >60
Osmolality: 275 (ref 275–301)
Potassium: 4.7 mmol/L (ref 3.5–5.1)
SGPT (ALT): 20 U/L (ref 12–78)
Sodium: 138 mmol/L (ref 136–145)

## 2013-06-07 LAB — CBC WITH DIFFERENTIAL/PLATELET
Basophil #: 0 10*3/uL (ref 0.0–0.1)
Basophil %: 0.2 %
Eosinophil #: 0 10*3/uL (ref 0.0–0.7)
HCT: 42.7 % (ref 35.0–47.0)
Lymphocyte #: 0.6 10*3/uL — ABNORMAL LOW (ref 1.0–3.6)
Lymphocyte %: 5.5 %
MCH: 27.6 pg (ref 26.0–34.0)
MCHC: 31.5 g/dL — ABNORMAL LOW (ref 32.0–36.0)
MCV: 88 fL (ref 80–100)
Monocyte %: 2.1 %
Neutrophil #: 9.5 10*3/uL — ABNORMAL HIGH (ref 1.4–6.5)
Neutrophil %: 92.2 %
Platelet: 180 10*3/uL (ref 150–440)
RBC: 4.86 10*6/uL (ref 3.80–5.20)

## 2013-06-08 LAB — BASIC METABOLIC PANEL
Anion Gap: 7 (ref 7–16)
Calcium, Total: 7.9 mg/dL — ABNORMAL LOW (ref 8.5–10.1)
Chloride: 107 mmol/L (ref 98–107)
Co2: 22 mmol/L (ref 21–32)
EGFR (African American): >60
Glucose: 136 mg/dL — ABNORMAL HIGH (ref 65–99)
Osmolality: 271 (ref 275–301)
Potassium: 3.7 mmol/L (ref 3.5–5.1)
Sodium: 136 mmol/L (ref 136–145)

## 2013-06-08 LAB — URINE CULTURE

## 2013-06-08 LAB — CBC WITH DIFFERENTIAL/PLATELET
Basophil #: 0 10*3/uL (ref 0.0–0.1)
Eosinophil #: 0 10*3/uL (ref 0.0–0.7)
Eosinophil %: 0 %
HGB: 13.6 g/dL (ref 12.0–16.0)
Lymphocyte #: 0.6 10*3/uL — ABNORMAL LOW (ref 1.0–3.6)
Lymphocyte %: 5.3 %
MCH: 27.5 pg (ref 26.0–34.0)
MCHC: 31.6 g/dL — ABNORMAL LOW (ref 32.0–36.0)
Monocyte %: 3.5 %
WBC: 10.3 10*3/uL (ref 3.6–11.0)

## 2013-06-08 LAB — HEPATIC FUNCTION PANEL A (ARMC)
Albumin: 2.4 g/dL — ABNORMAL LOW (ref 3.4–5.0)
Bilirubin, Direct: <0.1 mg/dL (ref 0.00–0.20)
Bilirubin,Total: 0.2 mg/dL (ref 0.2–1.0)
SGOT(AST): 47 U/L — ABNORMAL HIGH (ref 15–37)

## 2013-06-09 LAB — CBC WITH DIFFERENTIAL/PLATELET
Basophil #: 0 10*3/uL (ref 0.0–0.1)
Eosinophil #: 0 10*3/uL (ref 0.0–0.7)
Eosinophil %: 0 %
Lymphocyte #: 0.5 10*3/uL — ABNORMAL LOW (ref 1.0–3.6)
Lymphocyte %: 4.7 %
MCHC: 31 g/dL — ABNORMAL LOW (ref 32.0–36.0)
MCV: 87 fL (ref 80–100)
Monocyte %: 3.9 %
Neutrophil %: 91.2 %
RBC: 5.9 10*6/uL — ABNORMAL HIGH (ref 3.80–5.20)
RDW: 19.8 % — ABNORMAL HIGH (ref 11.5–14.5)
WBC: 11.7 10*3/uL — ABNORMAL HIGH (ref 3.6–11.0)

## 2013-06-09 LAB — PHOSPHORUS
Phosphorus: 1.1 mg/dL — ABNORMAL LOW (ref 2.5–4.9)
Phosphorus: 2.1 mg/dL — ABNORMAL LOW (ref 2.5–4.9)

## 2013-06-09 LAB — CK TOTAL AND CKMB (NOT AT ARMC)
CK, Total: 110 U/L (ref 21–215)
CK-MB: 3.6 ng/mL (ref 0.5–3.6)

## 2013-06-09 LAB — COMPREHENSIVE METABOLIC PANEL
Albumin: 2.5 g/dL — ABNORMAL LOW (ref 3.4–5.0)
Alkaline Phosphatase: 84 U/L (ref 50–136)
Anion Gap: 3 — ABNORMAL LOW (ref 7–16)
BUN: 6 mg/dL — ABNORMAL LOW (ref 7–18)
Bilirubin,Total: 0.3 mg/dL (ref 0.2–1.0)
Calcium, Total: 7.5 mg/dL — ABNORMAL LOW (ref 8.5–10.1)
Creatinine: 0.77 mg/dL (ref 0.60–1.30)
EGFR (Non-African Amer.): >60
Osmolality: 274 (ref 275–301)
Potassium: 3.7 mmol/L (ref 3.5–5.1)
SGOT(AST): 24 U/L (ref 15–37)
SGPT (ALT): 24 U/L (ref 12–78)
Sodium: 137 mmol/L (ref 136–145)

## 2013-06-09 LAB — MAGNESIUM: Magnesium: 1.6 mg/dL — ABNORMAL LOW

## 2013-06-09 LAB — TROPONIN I: Troponin-I: 0.37 ng/mL — ABNORMAL HIGH

## 2013-06-10 DIAGNOSIS — I509 Heart failure, unspecified: Secondary | ICD-10-CM

## 2013-06-10 LAB — COMPREHENSIVE METABOLIC PANEL
Alkaline Phosphatase: 85 U/L (ref 50–136)
Calcium, Total: 7.8 mg/dL — ABNORMAL LOW (ref 8.5–10.1)
Chloride: 105 mmol/L (ref 98–107)
Creatinine: 0.88 mg/dL (ref 0.60–1.30)
EGFR (African American): >60
Osmolality: 279 (ref 275–301)
Potassium: 3.1 mmol/L — ABNORMAL LOW (ref 3.5–5.1)
SGPT (ALT): 26 U/L (ref 12–78)
Sodium: 139 mmol/L (ref 136–145)
Total Protein: 6.8 g/dL (ref 6.4–8.2)

## 2013-06-10 LAB — MAGNESIUM
Magnesium: 2.2 mg/dL
Magnesium: 1.8 mg/dL

## 2013-06-10 LAB — POTASSIUM: Potassium: 3.9 mmol/L (ref 3.5–5.1)

## 2013-06-10 LAB — PHOSPHORUS: Phosphorus: 1.3 mg/dL — ABNORMAL LOW (ref 2.5–4.9)

## 2013-06-11 LAB — CBC WITH DIFFERENTIAL/PLATELET
Basophil %: 0.1 %
Eosinophil #: 0 10*3/uL (ref 0.0–0.7)
Lymphocyte #: 0.5 10*3/uL — ABNORMAL LOW (ref 1.0–3.6)
Lymphocyte %: 4.1 %
MCH: 27.1 pg (ref 26.0–34.0)
Monocyte %: 2.3 %
Platelet: 152 10*3/uL (ref 150–440)
RBC: 5.78 10*6/uL — ABNORMAL HIGH (ref 3.80–5.20)

## 2013-06-11 LAB — BASIC METABOLIC PANEL
Anion Gap: 4 — ABNORMAL LOW (ref 7–16)
BUN: 13 mg/dL (ref 7–18)
Chloride: 108 mmol/L — ABNORMAL HIGH (ref 98–107)
Co2: 29 mmol/L (ref 21–32)
EGFR (Non-African Amer.): >60
Glucose: 184 mg/dL — ABNORMAL HIGH (ref 65–99)
Potassium: 3.9 mmol/L (ref 3.5–5.1)

## 2013-06-12 LAB — BASIC METABOLIC PANEL
BUN: 20 mg/dL — ABNORMAL HIGH (ref 7–18)
Chloride: 99 mmol/L (ref 98–107)
Co2: 31 mmol/L (ref 21–32)
EGFR (African American): >60
EGFR (Non-African Amer.): >60
Osmolality: 283 (ref 275–301)
Sodium: 138 mmol/L (ref 136–145)

## 2013-06-12 LAB — CULTURE, BLOOD (SINGLE)

## 2013-06-13 LAB — BASIC METABOLIC PANEL
Anion Gap: 8 (ref 7–16)
BUN: 27 mg/dL — ABNORMAL HIGH (ref 7–18)
Calcium, Total: 8.3 mg/dL — ABNORMAL LOW (ref 8.5–10.1)
Chloride: 98 mmol/L (ref 98–107)
Creatinine: 0.85 mg/dL (ref 0.60–1.30)
EGFR (African American): >60
EGFR (Non-African Amer.): >60
Osmolality: 280 (ref 275–301)
Potassium: 3.7 mmol/L (ref 3.5–5.1)

## 2013-06-13 LAB — CLOSTRIDIUM DIFFICILE BY PCR

## 2013-06-13 LAB — PHOSPHORUS: Phosphorus: 2.9 mg/dL (ref 2.5–4.9)

## 2013-06-14 LAB — CBC WITH DIFFERENTIAL/PLATELET
Basophil #: 0 10*3/uL (ref 0.0–0.1)
Basophil %: 0.1 %
Eosinophil #: 0 10*3/uL (ref 0.0–0.7)
Eosinophil %: 0.1 %
HCT: 48.6 % — ABNORMAL HIGH (ref 35.0–47.0)
HGB: 16.1 g/dL — ABNORMAL HIGH (ref 12.0–16.0)
Lymphocyte #: 0.7 10*3/uL — ABNORMAL LOW (ref 1.0–3.6)
Lymphocyte %: 4.7 %
MCH: 27.5 pg (ref 26.0–34.0)
MCV: 83 fL (ref 80–100)
Monocyte %: 3.5 %
Neutrophil %: 91.6 %
RBC: 5.85 10*6/uL — ABNORMAL HIGH (ref 3.80–5.20)
RDW: 21.1 % — ABNORMAL HIGH (ref 11.5–14.5)

## 2013-06-14 LAB — BASIC METABOLIC PANEL
BUN: 27 mg/dL — ABNORMAL HIGH (ref 7–18)
Calcium, Total: 8.1 mg/dL — ABNORMAL LOW (ref 8.5–10.1)
Chloride: 96 mmol/L — ABNORMAL LOW (ref 98–107)
Co2: 36 mmol/L — ABNORMAL HIGH (ref 21–32)
EGFR (Non-African Amer.): >60
Osmolality: 281 (ref 275–301)
Potassium: 4.5 mmol/L (ref 3.5–5.1)
Sodium: 137 mmol/L (ref 136–145)

## 2013-06-15 ENCOUNTER — Other Ambulatory Visit (HOSPITAL_COMMUNITY): Payer: Medicare Other

## 2013-06-15 ENCOUNTER — Inpatient Hospital Stay
Admission: EM | Admit: 2013-06-15 | Discharge: 2013-07-05 | Disposition: A | Discharge status: Home Health | Payer: Medicare Other | Attending: Internal Medicine | Admitting: Internal Medicine

## 2013-06-15 ENCOUNTER — Ambulatory Visit (HOSPITAL_COMMUNITY)
Admission: AD | Admit: 2013-06-15 | Discharge: 2013-06-15 | Disposition: A | Discharge status: Home / Self Care | Payer: Medicare Other | Source: Other Acute Inpatient Hospital | Attending: Internal Medicine | Admitting: Internal Medicine

## 2013-06-15 DIAGNOSIS — R918 Other nonspecific abnormal finding of lung field: Secondary | ICD-10-CM

## 2013-06-15 DIAGNOSIS — J449 Chronic obstructive pulmonary disease, unspecified: Secondary | ICD-10-CM

## 2013-06-15 DIAGNOSIS — J9600 Acute respiratory failure, unspecified whether with hypoxia or hypercapnia: Secondary | ICD-10-CM | POA: Insufficient documentation

## 2013-06-15 DIAGNOSIS — J9601 Acute respiratory failure with hypoxia: Secondary | ICD-10-CM

## 2013-06-15 LAB — BASIC METABOLIC PANEL
Anion Gap: 5 — ABNORMAL LOW (ref 7–16)
BUN: 31 mg/dL — ABNORMAL HIGH (ref 7–18)
Calcium, Total: 7.6 mg/dL — ABNORMAL LOW (ref 8.5–10.1)
Chloride: 95 mmol/L — ABNORMAL LOW (ref 98–107)
Co2: 36 mmol/L — ABNORMAL HIGH (ref 21–32)
Creatinine: 0.89 mg/dL (ref 0.60–1.30)
EGFR (African American): >60
EGFR (Non-African Amer.): >60
Glucose: 190 mg/dL — ABNORMAL HIGH (ref 65–99)
Osmolality: 284 (ref 275–301)
Potassium: 4 mmol/L (ref 3.5–5.1)
Sodium: 136 mmol/L (ref 136–145)

## 2013-06-15 LAB — CBC WITH DIFFERENTIAL/PLATELET
Basophil #: 0.1 10*3/uL (ref 0.0–0.1)
Basophil %: 0.6 %
Eosinophil #: 0 10*3/uL (ref 0.0–0.7)
Eosinophil %: 0.1 %
HCT: 45.2 % (ref 35.0–47.0)
HGB: 14.7 g/dL (ref 12.0–16.0)
Lymphocyte #: 0.7 10*3/uL — ABNORMAL LOW (ref 1.0–3.6)
Lymphocyte %: 4.4 %
MCH: 27.2 pg (ref 26.0–34.0)
MCHC: 32.4 g/dL (ref 32.0–36.0)
MCV: 84 fL (ref 80–100)
Monocyte #: 0.6 x10 3/mm (ref 0.2–0.9)
Monocyte %: 4.3 %
Neutrophil #: 13.5 10*3/uL — ABNORMAL HIGH (ref 1.4–6.5)
Neutrophil %: 90.6 %
Platelet: 203 10*3/uL (ref 150–440)
RBC: 5.38 10*6/uL — ABNORMAL HIGH (ref 3.80–5.20)
RDW: 21.2 % — ABNORMAL HIGH (ref 11.5–14.5)
WBC: 14.9 10*3/uL — ABNORMAL HIGH (ref 3.6–11.0)

## 2013-06-15 LAB — BLOOD GAS, ARTERIAL
MECHVT: 450 mL
O2 Saturation: 94.9 %
Patient temperature: 98.6
TCO2: 37.8 mmol/L (ref 0–100)
pO2, Arterial: 73.1 mmHg — ABNORMAL LOW (ref 80.0–100.0)

## 2013-06-15 LAB — EXPECTORATED SPUTUM ASSESSMENT W GRAM STAIN, RFLX TO RESP C

## 2013-06-16 ENCOUNTER — Other Ambulatory Visit (HOSPITAL_COMMUNITY): Payer: Medicare Other

## 2013-06-16 DIAGNOSIS — J449 Chronic obstructive pulmonary disease, unspecified: Secondary | ICD-10-CM

## 2013-06-16 DIAGNOSIS — J9601 Acute respiratory failure with hypoxia: Secondary | ICD-10-CM

## 2013-06-16 DIAGNOSIS — J9600 Acute respiratory failure, unspecified whether with hypoxia or hypercapnia: Secondary | ICD-10-CM

## 2013-06-16 LAB — COMPREHENSIVE METABOLIC PANEL
ALT: 30 U/L (ref 0–35)
AST: 20 U/L (ref 0–37)
Albumin: 2.7 g/dL — ABNORMAL LOW (ref 3.5–5.2)
Alkaline Phosphatase: 48 U/L (ref 39–117)
BUN: 30 mg/dL — ABNORMAL HIGH (ref 6–23)
CO2: 35 mEq/L — ABNORMAL HIGH (ref 19–32)
Calcium: 8.6 mg/dL (ref 8.4–10.5)
Chloride: 95 mEq/L — ABNORMAL LOW (ref 96–112)
Creatinine, Ser: 0.62 mg/dL (ref 0.50–1.10)
GFR calc Af Amer: >90 mL/min (ref >90)
GFR calc non Af Amer: >90 mL/min (ref >90)
Glucose, Bld: 126 mg/dL — ABNORMAL HIGH (ref 70–99)
Potassium: 3.9 mEq/L (ref 3.5–5.1)
Sodium: 142 mEq/L (ref 135–145)
Total Bilirubin: 0.8 mg/dL (ref 0.3–1.2)
Total Protein: 6.2 g/dL (ref 6.0–8.3)

## 2013-06-16 LAB — CBC
HCT: 49.6 % — ABNORMAL HIGH (ref 36.0–46.0)
Hemoglobin: 16.1 g/dL — ABNORMAL HIGH (ref 12.0–15.0)
MCH: 27.4 pg (ref 26.0–34.0)
MCHC: 32.5 g/dL (ref 30.0–36.0)
MCV: 84.4 fL (ref 78.0–100.0)
Platelets: 220 10*3/uL (ref 150–400)
RBC: 5.88 MIL/uL — ABNORMAL HIGH (ref 3.87–5.11)
RDW: 19.8 % — ABNORMAL HIGH (ref 11.5–15.5)
WBC: 18.9 10*3/uL — ABNORMAL HIGH (ref 4.0–10.5)

## 2013-06-16 LAB — PROCALCITONIN: Procalcitonin: <0.1 ng/mL

## 2013-06-16 LAB — PHOSPHORUS: Phosphorus: 3.7 mg/dL (ref 2.3–4.6)

## 2013-06-16 NOTE — Consult Note (Signed)
PULMONARY  / CRITICAL CARE MEDICINE  Name: Jane Roberts MRN: [id number] DOB: November 28, 1966    ADMISSION DATE:  06/15/2013 CONSULTATION DATE:  7/5  REFERRING MD :  Hijazi  PRIMARY SERVICE:  select  CHIEF COMPLAINT:  Respiratory failure   BRIEF PATIENT DESCRIPTION:  This is a 46YOF who was admitted to [facility] on 6/25 w/ delirium and acute respiratory failure in setting of probable aspiration w/ ALI +/- decompensated diastolic dysfxn in setting of Severe sepsis due to UT source (klebsiella PNA). Intubated, treated w/ appropriate abx, achieved hemodynamic stability but failed weaning attempts (X3), felt possibly to have element of COPD (not previously diagnosed) complicating weaning efforts. Transferred to [facility] on 7/4 for ventilator weaning.    SIGNIFICANT EVENTS / STUDIES:  ECHO 6/29: normal LV fxn EF 55-60% Diastolic fxn not determined.   LINES / TUBES: oett 6/25>>>  CULTURES: UC at [place]: Klebsiella  ANTIBIOTICS: levaquin 7/3>>>   PAST MEDICAL HISTORY :  Seizure d/o, bipolar disease,migraines, chronic pain, ETOH, COPD, intubated once before   Prior to Admission medications   Pulmicort, levaquin, protonix, mag oxide, KCL, lasix, duoneb, chlorahex, lantus, lovenox, solumedrol,   Allergies  Effexor, haldol, PCN, prozac  FAMILY HISTORY:  Anxiety and depression  SOCIAL HISTORY: Lives w/ son, + smoker.  Full Code   REVIEW OF SYSTEMS:   unable   SUBJECTIVE:  Looks comfortable on SBT VITAL SIGNS:  reviewed sats 96% on 50%  PHYSICAL EXAMINATION: General:  Chronically ill appearing White female, no distress on SBT Neuro:  Awake, alert, follows commands HEENT:  Orally intubated  Cardiovascular:  rrr Lungs: occ rhonchi, no wheeze, vt in 500s on PS of 5 Abdomen:  Non-tender Musculoskeletal: intact  Skin:  Intact    Recent Labs Lab 06/16/13 0635  NA 142  K 3.9  CL 95*  CO2 35*  BUN 30*  CREATININE 0.62  GLUCOSE 126*    Recent Labs Lab 06/16/13 0635  HGB 16.1*   HCT 49.6*  WBC 18.9*  PLT 220   ABG    Component Value Date/Time   PHART 7.512* 06/15/2013 1720   PCO2ART 45.7* 06/15/2013 1720   PO2ART 73.1* 06/15/2013 1720   HCO3 36.4* 06/15/2013 1720   TCO2 37.8 06/15/2013 1720   O2SAT 94.9 06/15/2013 1720    PCXR: hyperinflated, right mid lung atx   ASSESSMENT / PLAN: Acute respiratory failure (possibly acute on chronic) w/ failure to wean from ventilator Probable COPD w/ resolving AECOPD: flat HD/ hyperinflated film, lifelong smoker.  Possible Aspiration PNA: culture neg, CXR clearing  Resolved ALI: CXR clearing  Possible diastolic dysfxn: no evidence of heart failure from CXR stand-point Leukocytosis: steroids may be contributing Mild Polycythemia: suspect this supports element of chronic resp failure, has been intubated X 1 before  Acute encephalopathy (resolved) suspect this was due to hypercarbia and sepsis Bipolar disease, chronic pain, seizure DO  This is a 56 yof w/ probable element of chronic resp failure in setting of COPD, admitted to [facility] after being treated for sepsis from UTI, complicated by either aspiration PNA vs ALI, (now radiographically cleared), and AECOPD and HCRF. She looks good on SBT. No evidence of ongoing infection, follow commands and bronchospasm appears to have resolved. Think she is close to extubation. Will push hard for next 24-48hrs. If fails would need trach as has been intubated since 6/25  Recommendations SBT, cpap5 ps 5, goal 30 min, if extubated will need O2 titration to sats goal 88-92% Stop diprivan, if fails SBT trial would transition  to precedex Cont BD therapy (agree needs triple combo: w/ duobneb and ICS) Decrease systemic steroids Keep euvolemic abx per #1 svc (although seems like these could be stopped anytime now) Will need out-pt pulm referral after d/c Needs to stop smoking If remains vented, may need reduction in MV  Ccm time 30 min  Mcarthur Rossetti. Tyson Alias, MD, FACP Pgr: [phone] [place]  Pulmonary & Critical Care  Pulmonary and Critical Care Medicine [facility] Pager: [phone]  06/16/2013, 9:38 AM

## 2013-06-17 LAB — PREALBUMIN: Prealbumin: 47.1 mg/dL — ABNORMAL HIGH (ref 17.0–34.0)

## 2013-06-17 LAB — TSH: TSH: 6.007 u[IU]/mL — ABNORMAL HIGH (ref 0.350–4.500)

## 2013-06-18 LAB — BASIC METABOLIC PANEL
BUN: 36 mg/dL — ABNORMAL HIGH (ref 6–23)
Calcium: 9.3 mg/dL (ref 8.4–10.5)
Chloride: 101 mEq/L (ref 96–112)
Glucose, Bld: 134 mg/dL — ABNORMAL HIGH (ref 70–99)
Potassium: 3.9 mEq/L (ref 3.5–5.1)

## 2013-06-18 LAB — CBC
MCH: 28 pg (ref 26.0–34.0)
Platelets: 235 10*3/uL (ref 150–400)
RBC: 6.5 MIL/uL — ABNORMAL HIGH (ref 3.87–5.11)
RDW: 19.9 % — ABNORMAL HIGH (ref 11.5–15.5)
WBC: 22.9 10*3/uL — ABNORMAL HIGH (ref 4.0–10.5)

## 2013-06-18 NOTE — Progress Notes (Signed)
PULMONARY  / CRITICAL CARE MEDICINE  Name: Jane Roberts MRN: [id number] DOB: 09/22/1966    ADMISSION DATE:  06/15/2013 CONSULTATION DATE:  7/5  REFERRING MD :  Hijazi  PRIMARY SERVICE:  select  CHIEF COMPLAINT:  Respiratory failure   BRIEF PATIENT DESCRIPTION:  This is a 46YOF who was admitted to [facility] on 6/25 w/ delirium and acute respiratory failure in setting of probable aspiration w/ ALI +/- decompensated diastolic dysfxn in setting of Severe sepsis due to UT source (klebsiella PNA). Intubated, treated w/ appropriate abx, achieved hemodynamic stability but failed weaning attempts (X3), felt possibly to have element of COPD (not previously diagnosed) complicating weaning efforts. Transferred to [facility] on 7/4 for ventilator weaning.    SIGNIFICANT EVENTS / STUDIES:  ECHO 6/29: normal LV fxn EF 55-60% Diastolic fxn not determined.   LINES / TUBES: oett 6/25>>>  CULTURES: UC at [place]: Klebsiella  ANTIBIOTICS: levaquin 7/3>>>  SUBJECTIVE:  Looks comfortable on SBT VITAL SIGNS:  reviewed sats 96% on 50%  PHYSICAL EXAMINATION: General:  Chronically ill appearing White female, no distress on SBT Neuro:  Awake, alert, follows commands HEENT: [place], no JVD  Cardiovascular:  rrr Lungs: clear  Abdomen:  Non-tender Musculoskeletal: intact  Skin:  Intact    Recent Labs Lab 06/16/13 0635 06/18/13 0610  NA 142 145  K 3.9 3.9  CL 95* 101  CO2 35* 31  BUN 30* 36*  CREATININE 0.62 0.74  GLUCOSE 126* 134*    Recent Labs Lab 06/16/13 0635 06/18/13 0610  HGB 16.1* 18.2*  HCT 49.6* 55.0*  WBC 18.9* 22.9*  PLT 220 235   PCXR: hyperinflated, right mid lung atx   ASSESSMENT / PLAN: Acute respiratory failure (possibly acute on chronic) w/ failure to wean from ventilator: extubated on 7/5 Probable COPD w/ resolving AECOPD: flat HD/ hyperinflated film, lifelong smoker.  Possible Aspiration PNA: culture neg, CXR clearing  Resolved ALI: CXR clearing  Possible diastolic dysfxn:  no evidence of heart failure from CXR stand-point Leukocytosis: steroids may be contributing Mild Polycythemia: suspect this supports element of chronic resp failure, has been intubated X 1 before  Acute encephalopathy (resolved) suspect this was due to hypercarbia and sepsis Bipolar disease, chronic pain, seizure DO  This is a 35 yof w/ probable element of chronic resp failure in setting of COPD, admitted to [facility] after being treated for sepsis from UTI, complicated by either aspiration PNA vs ALI, (now radiographically cleared), and AECOPD and HCRF. We extubated her on 5/5. Looks great from [employer]. Psych issues are her biggest problem now.   Recommendations Cont BD therapy (agree needs triple combo: w/ duobneb and ICS) Decrease systemic steroids Keep euvolemic abx per #1 svc (although seems like these could be stopped anytime now) Will need out-pt pulm referral after d/c (close to her home)  Needs to stop smoking  Billy Fischer, MD ; [facility] service Mobile [phone].  After 5:30 PM or weekends, call [phone]  06/18/2013, 8:49 AM

## 2013-06-19 ENCOUNTER — Other Ambulatory Visit (HOSPITAL_COMMUNITY): Payer: Medicare Other

## 2013-06-19 DIAGNOSIS — R918 Other nonspecific abnormal finding of lung field: Secondary | ICD-10-CM

## 2013-06-19 LAB — BLOOD GAS, ARTERIAL
Bicarbonate: 27.8 mEq/L — ABNORMAL HIGH (ref 20.0–24.0)
O2 Content: 4 L/min
TCO2: 28.9 mmol/L (ref 0–100)
pCO2 arterial: 37 mmHg (ref 35.0–45.0)

## 2013-06-19 LAB — CBC
MCH: 27.2 pg (ref 26.0–34.0)
MCHC: 32 g/dL (ref 30.0–36.0)
MCV: 84.8 fL (ref 78.0–100.0)

## 2013-06-19 NOTE — Progress Notes (Signed)
PULMONARY  / CRITICAL CARE MEDICINE  Name: Jane Roberts MRN: [id number] DOB: 08-Aug-1966    ADMISSION DATE:  06/15/2013 CONSULTATION DATE:  7/5  REFERRING MD :  Hijazi  PRIMARY SERVICE:  select  CHIEF COMPLAINT:  Respiratory failure   BRIEF PATIENT DESCRIPTION:  This is a 46YOF who was admitted to [facility] on 6/25 w/ delirium and acute respiratory failure in setting of probable aspiration w/ ALI +/- decompensated diastolic dysfxn in setting of Severe sepsis due to UT source (klebsiella PNA). Intubated, treated w/ appropriate abx, achieved hemodynamic stability but failed weaning attempts (X3), felt possibly to have element of COPD (not previously diagnosed) complicating weaning efforts. Transferred to [facility] on 7/4 for ventilator weaning.    SIGNIFICANT EVENTS / STUDIES:  ECHO 6/29: normal LV fxn EF 55-60% Diastolic fxn not determined.   LINES / TUBES: oett 6/25>>>  CULTURES: UC at [place]: Klebsiella  ANTIBIOTICS: levaquin 7/3>>>  SUBJECTIVE:  Looks comfortable on SBT VITAL SIGNS:  reviewed sats 96% on 100%   PHYSICAL EXAMINATION: General:  Chronically ill appearing White female, agitated and restless  Neuro:  Awake, alert, follows commands HEENT: [place], no JVD  Cardiovascular:  rrr Lungs: crackles in bases. Decreased in LLL Abdomen:  Non-tender Musculoskeletal: intact  Skin:  Intact    Recent Labs Lab 06/16/13 0635 06/18/13 0610  NA 142 145  K 3.9 3.9  CL 95* 101  CO2 35* 31  BUN 30* 36*  CREATININE 0.62 0.74  GLUCOSE 126* 134*    Recent Labs Lab 06/16/13 0635 06/18/13 0610 06/19/13 0600  HGB 16.1* 18.2* 17.9*  HCT 49.6* 55.0* 55.9*  WBC 18.9* 22.9* 31.4*  PLT 220 235 264   PCXR: hyperinflated, right mid lung atx   ASSESSMENT / PLAN: Acute respiratory failure (possibly acute on chronic) w/ failure to wean from ventilator: -extubated on 7/5 -Recurrent episode of acute hypoxic resp failure on 7/8.  Probable COPD w/ resolving AECOPD: flat HD/ hyperinflated  film, lifelong smoker.  Possible Aspiration PNA w/ what looks like new PNA (possibly recurrent aspiration) vs LLL ATX on 7/8 SIRS/Sepsis: prob d/t PNA but also consider CVL as source.  Possible diastolic dysfxn: no evidence of heart failure from CXR stand-point Leukocytosis: steroids may be contributing Mild Polycythemia: suspect this supports element of chronic resp failure, has been intubated X 1 before  Acute encephalopathy (resolved) suspect this was due to hypercarbia and sepsis Bipolar disease, chronic pain, seizure DO  This is a 46 yof w/ probable element of chronic resp failure in setting of COPD, admitted to [facility] after being treated for sepsis from UTI, complicated by either aspiration PNA vs ALI, (now radiographically cleared), and AECOPD and HCRF. We extubated her on 5/5.   -on 7/8 has acute episode of respiratory failure, agitation and new LLL consolidation. Spoke to primary team. ATX vs aspiration are high on diff dx given her psych issues. Does seem like she has responded well to high flow FIO2. Now meeting sepsis criteria in setting of LLL consolidation, also right IJ is old.   Recommendations Cont BD therapy (agree needs triple combo: w/ duobneb and ICS) Cont current steroid dose  Keep euvolemic abx per #1 svc (agree w/ current restart of maxipime and vanc 7/8) CT chest reasonable as her hypoxia does seem out of proportion to her new LLL consolidation  High flow oxygen. Think we can avoid intubation for now.  Agree w/ CT head.  Will need out-pt pulm referral after d/c (close to her home)  Needs to  stop smoking  Billy Fischer, MD ; [facility] [phone].  After 5:30 PM or weekends, call [phone]  06/19/2013, 11:46 AM

## 2013-06-20 ENCOUNTER — Other Ambulatory Visit (HOSPITAL_COMMUNITY): Payer: Medicare Other

## 2013-06-20 LAB — CBC WITH DIFFERENTIAL/PLATELET
Basophils Relative: 0 % (ref 0–1)
Eosinophils Absolute: 0 10*3/uL (ref 0.0–0.7)
Eosinophils Relative: 0 % (ref 0–5)
HCT: 55.8 % — ABNORMAL HIGH (ref 36.0–46.0)
MCH: 27.7 pg (ref 26.0–34.0)
Monocytes Absolute: 1.3 10*3/uL — ABNORMAL HIGH (ref 0.1–1.0)
Platelets: 295 10*3/uL (ref 150–400)
RDW: 20.5 % — ABNORMAL HIGH (ref 11.5–15.5)

## 2013-06-20 MED ORDER — IOHEXOL 350 MG/ML SOLN
100.0000 mL | Freq: Once | INTRAVENOUS | Status: AC | PRN
  Administered 2013-06-20: 100 mL via INTRAVENOUS

## 2013-06-20 NOTE — Progress Notes (Signed)
PULMONARY  / CRITICAL CARE MEDICINE  Name: Jane Roberts MRN: [id number] DOB: 1966/05/18    ADMISSION DATE:  06/15/2013 CONSULTATION DATE:  7/5  REFERRING MD :  Hijazi  PRIMARY SERVICE:  select  CHIEF COMPLAINT:  Respiratory failure   BRIEF PATIENT DESCRIPTION:  This is a 46YOF who was admitted to [facility] on 6/25 w/ delirium and acute respiratory failure in setting of probable aspiration w/ ALI +/- decompensated diastolic dysfxn in setting of Severe sepsis due to UT source (klebsiella PNA). Intubated, treated w/ appropriate abx, achieved hemodynamic stability but failed weaning attempts (X3), felt possibly to have element of COPD (not previously diagnosed) complicating weaning efforts. Transferred to [facility] on 7/4 for ventilator weaning.    SIGNIFICANT EVENTS / STUDIES:  ECHO 6/29: normal LV fxn EF 55-60% Diastolic fxn not determined.   LINES / TUBES: oett 6/25>>>  CULTURES: UC at [place]: Klebsiella  ANTIBIOTICS: levaquin 7/3>>>  SUBJECTIVE:  Looks comfortable on SBT VITAL SIGNS:  reviewed sats 96% on 90%   PHYSICAL EXAMINATION: General:  Chronically ill appearing White female, looks a little more toxic today   Neuro:  Awake, alert, follows commands HEENT: [place], no JVD  Cardiovascular:  rrr Lungs: crackles in bases. Decreased in LLL Abdomen:  Non-tender Musculoskeletal: intact  Skin:  Intact    Recent Labs Lab 06/16/13 0635 06/18/13 0610  NA 142 145  K 3.9 3.9  CL 95* 101  CO2 35* 31  BUN 30* 36*  CREATININE 0.62 0.74  GLUCOSE 126* 134*    Recent Labs Lab 06/18/13 0610 06/19/13 0600 06/20/13 0615  HGB 18.2* 17.9* 18.2*  HCT 55.0* 55.9* 55.8*  WBC 22.9* 31.4* 35.3*  PLT 235 264 295   PCXR: hyperinflated, right mid lung atx   ASSESSMENT / PLAN: Acute respiratory failure (possibly acute on chronic) w/ failure to wean from ventilator: -extubated on 7/5 -Recurrent episode of acute hypoxic resp failure on 7/8.  Probable COPD w/ resolving AECOPD: flat HD/  hyperinflated film, lifelong smoker.  Possible Aspiration PNA w/ what looks like new PNA (possibly recurrent aspiration) vs LLL ATX on 7/8 SIRS/Sepsis: prob d/t PNA but also consider CVL as source.  Possible diastolic dysfxn: no evidence of heart failure from CXR stand-point Leukocytosis: steroids may be contributing Mild Polycythemia: suspect this supports element of chronic resp failure, has been intubated X 1 before  Acute encephalopathy (resolved) suspect this was due to hypercarbia and sepsis Bipolar disease, chronic pain, seizure DO  This is a 46 yof w/ probable element of chronic resp failure in setting of COPD, admitted to [facility] after being treated for sepsis from UTI, complicated by either aspiration PNA vs ALI, (now radiographically cleared), and AECOPD and HCRF. We extubated her on 5/5.   -on 7/9  Still hypoxic and meeting SIRS sepsis criteria. CXR showing improved aeration of LLL but she still fio2 needs seemingly of proportion to her film   Recommendations Cont BD therapy (agree needs triple combo: w/ duobneb and ICS) Cont current steroid dose  Keep euvolemic abx per #1 svc (agree w/ current restart of maxipime and vanc 7/8, also added antifungal on 7/9) CT chest reasonable as her hypoxia does seem out of proportion to her new LLL consolidation  High flow oxygen. Think we can avoid intubation for now.  Agree w/ CT head.  Will need out-pt pulm referral after d/c (close to her home)  Needs to stop smoking  Billy Fischer, MD ; [facility] service Mobile [phone].  After 5:30 PM or weekends, call  [phone]  06/20/2013, 2:25 PM

## 2013-06-21 ENCOUNTER — Other Ambulatory Visit (HOSPITAL_COMMUNITY): Payer: Medicare Other

## 2013-06-21 LAB — BASIC METABOLIC PANEL
BUN: 44 mg/dL — ABNORMAL HIGH (ref 6–23)
CO2: 19 mEq/L (ref 19–32)
CO2: 20 mEq/L (ref 19–32)
Calcium: 8.9 mg/dL (ref 8.4–10.5)
Chloride: 126 mEq/L — ABNORMAL HIGH (ref 96–112)
Creatinine, Ser: 0.84 mg/dL (ref 0.50–1.10)
Creatinine, Ser: 0.86 mg/dL (ref 0.50–1.10)
GFR calc Af Amer: >90 mL/min (ref >90)
GFR calc non Af Amer: 83 mL/min — ABNORMAL LOW (ref >90)
GFR calc non Af Amer: 80 mL/min — ABNORMAL LOW (ref >90)
Glucose, Bld: 150 mg/dL — ABNORMAL HIGH (ref 70–99)
Sodium: 155 mEq/L — ABNORMAL HIGH (ref 135–145)
Sodium: 156 mEq/L — ABNORMAL HIGH (ref 135–145)

## 2013-06-21 LAB — RAPID HIV SCREEN (WH-MAU): Rapid HIV Screen: NONREACTIVE

## 2013-06-21 LAB — MAGNESIUM: Magnesium: 2.8 mg/dL — ABNORMAL HIGH (ref 1.5–2.5)

## 2013-06-21 LAB — CBC
Hemoglobin: 14.4 g/dL (ref 12.0–15.0)
MCHC: 30.8 g/dL (ref 30.0–36.0)
RBC: 5.36 MIL/uL — ABNORMAL HIGH (ref 3.87–5.11)
WBC: 21.3 10*3/uL — ABNORMAL HIGH (ref 4.0–10.5)

## 2013-06-22 ENCOUNTER — Other Ambulatory Visit (HOSPITAL_COMMUNITY): Payer: Medicare Other

## 2013-06-22 LAB — CBC
HCT: 44.2 % (ref 36.0–46.0)
MCHC: 31.4 g/dL (ref 30.0–36.0)
MCV: 85.5 fL (ref 78.0–100.0)
Platelets: 140 10*3/uL — ABNORMAL LOW (ref 150–400)
RDW: 20.5 % — ABNORMAL HIGH (ref 11.5–15.5)

## 2013-06-22 LAB — URINE MICROSCOPIC-ADD ON

## 2013-06-22 LAB — BASIC METABOLIC PANEL
CO2: 23 mEq/L (ref 19–32)
Calcium: 7.9 mg/dL — ABNORMAL LOW (ref 8.4–10.5)
Chloride: 112 mEq/L (ref 96–112)
Creatinine, Ser: 0.67 mg/dL (ref 0.50–1.10)
GFR calc Af Amer: >90 mL/min (ref >90)
GFR calc non Af Amer: >90 mL/min (ref >90)

## 2013-06-22 LAB — URINALYSIS, ROUTINE W REFLEX MICROSCOPIC
Ketones, ur: NEGATIVE mg/dL
Urobilinogen, UA: 0.2 mg/dL (ref 0.0–1.0)
pH: 6.5 (ref 5.0–8.0)

## 2013-06-22 LAB — NA AND K (SODIUM & POTASSIUM), RAND UR
Potassium Urine: 42 mEq/L
Sodium, Ur: 134 mEq/L

## 2013-06-22 LAB — CHLORIDE, URINE, RANDOM: Chloride Urine: 153 mEq/L

## 2013-06-22 LAB — CLOSTRIDIUM DIFFICILE BY PCR: Toxigenic C. Difficile by PCR: NEGATIVE

## 2013-06-22 NOTE — Progress Notes (Signed)
PULMONARY  / CRITICAL CARE MEDICINE  Name: Jane Roberts MRN: [id number] DOB: Nov 04, 1966    ADMISSION DATE:  06/15/2013 CONSULTATION DATE:  7/5  REFERRING MD :  Hijazi  PRIMARY SERVICE:  select  CHIEF COMPLAINT:  Respiratory failure   BRIEF PATIENT DESCRIPTION:  This is a 46YOF who was admitted to [facility] on 6/25 w/ delirium and acute respiratory failure in setting of probable aspiration w/ ALI +/- decompensated diastolic dysfxn in setting of Severe sepsis due to UT source (klebsiella PNA). Intubated, treated w/ appropriate abx, achieved hemodynamic stability but failed weaning attempts (X3), felt possibly to have element of COPD (not previously diagnosed) complicating weaning efforts. Transferred to [facility] on 7/4 for ventilator weaning.    SIGNIFICANT EVENTS / STUDIES:  ECHO 6/29: normal LV fxn EF 55-60% Diastolic fxn not determined.  CT chest 7/09:  Consolidation LLL. Atx RLL   LINES / TUBES: ETT 6/25>> 7/05 LUE PICC 7/07 >>    CULTURES: UC at [place]: Klebsiella  ANTIBIOTICS: Per primary  SUBJECTIVE:  Looks comfortable on SBT VITAL SIGNS:  reviewed sats 99% on 90%   PHYSICAL EXAMINATION: General:  Chronically ill appearing White female, looks a little more toxic today   Neuro:  Awake, alert, follows commands HEENT: [place], no JVD  Cardiovascular:  rrr Lungs: crackles in bases. Decreased in LLL Abdomen:  Non-tender Musculoskeletal: intact  Skin:  Intact    Recent Labs Lab 06/21/13 0630 06/21/13 1113 06/22/13 0208 06/22/13 0615  NA 155* 156* 145  --   K MARKED HEMOLYSIS 5.3* 7.0* 3.9  CL 126* 126* 112  --   CO2 19 20 23   --   BUN 49* 44* 31*  --   CREATININE 0.84 0.86 0.67  --   GLUCOSE 92 150* 211*  --     Recent Labs Lab 06/19/13 0600 06/20/13 0615 06/21/13 0630  HGB 17.9* 18.2* 14.4  HCT 55.9* 55.8* 46.7*  WBC 31.4* 35.3* 21.3*  PLT 264 295 172   PCXR: hyperinflated, right mid lung atx   ASSESSMENT / PLAN: Acute respiratory failure (possibly acute  on chronic) w/ failure to wean from ventilator: -extubated on 7/5 -Recurrent episode of acute hypoxic resp failure on 7/8. Improving Likely COPD, not presently wheezing  Probable LLL PNA SIRS/Sepsis: prob d/t PNA but also consider CVL as source.  Possible diastolic dysfxn: no evidence of heart failure from CXR stand-point Leukocytosis: steroids may be contributing Mild Polycythemia: suspect this supports element of chronic resp failure, has been intubated X 1 before  Acute encephalopathy (resolved) suspect this was due to hypercarbia and sepsis Bipolar disease, chronic pain, seizure DO  This is a 46 yof w/ probable element of chronic resp failure in setting of COPD, admitted to [facility] after being treated for sepsis from UTI, complicated by either aspiration PNA vs ALI, (now radiographically cleared), and AECOPD and HCRF. We extubated her on 5/5.   -on 7/9  Still hypoxic and meeting SIRS sepsis criteria. CXR showing improved aeration of LLL but she still fio2 needs seemingly of proportion to her film   Recommendations Cont abx per primary service for presumed LLL PNA Wean O2 as tolerated PCCM will sign off. Please call if we can be of further assistance  Billy Fischer, MD ; [facility] [phone].  After 5:30 PM or weekends, call [phone]  06/22/2013, 10:39 AM

## 2013-06-23 LAB — CBC
MCH: 26.6 pg (ref 26.0–34.0)
MCHC: 30.8 g/dL (ref 30.0–36.0)
Platelets: 110 10*3/uL — ABNORMAL LOW (ref 150–400)
RBC: 4.28 MIL/uL (ref 3.87–5.11)
RDW: 20 % — ABNORMAL HIGH (ref 11.5–15.5)

## 2013-06-23 LAB — URINE CULTURE: Colony Count: >=100000

## 2013-06-23 LAB — BASIC METABOLIC PANEL
CO2: 28 mEq/L (ref 19–32)
Calcium: 8 mg/dL — ABNORMAL LOW (ref 8.4–10.5)
Glucose, Bld: 162 mg/dL — ABNORMAL HIGH (ref 70–99)
Potassium: 4.3 mEq/L (ref 3.5–5.1)
Sodium: 133 mEq/L — ABNORMAL LOW (ref 135–145)

## 2013-06-24 LAB — CBC
Hemoglobin: 12.1 g/dL (ref 12.0–15.0)
MCH: 26.2 pg (ref 26.0–34.0)
MCV: 83.5 fL (ref 78.0–100.0)
RDW: 19.6 % — ABNORMAL HIGH (ref 11.5–15.5)
WBC: 12.9 10*3/uL — ABNORMAL HIGH (ref 4.0–10.5)

## 2013-06-25 LAB — BASIC METABOLIC PANEL
Calcium: 8.7 mg/dL (ref 8.4–10.5)
Chloride: 93 mEq/L — ABNORMAL LOW (ref 96–112)
Creatinine, Ser: 0.46 mg/dL — ABNORMAL LOW (ref 0.50–1.10)
GFR calc Af Amer: >90 mL/min (ref >90)
Potassium: 4 mEq/L (ref 3.5–5.1)

## 2013-06-25 LAB — CBC
RBC: 4.73 MIL/uL (ref 3.87–5.11)
RDW: 19.5 % — ABNORMAL HIGH (ref 11.5–15.5)

## 2013-06-30 LAB — BASIC METABOLIC PANEL
BUN: 23 mg/dL (ref 6–23)
CO2: 23 mEq/L (ref 19–32)
GFR calc Af Amer: >90 mL/min (ref >90)
GFR calc non Af Amer: >90 mL/min (ref >90)
Potassium: 4.6 mEq/L (ref 3.5–5.1)

## 2013-06-30 LAB — CBC
HCT: 35.6 % — ABNORMAL LOW (ref 36.0–46.0)
Hemoglobin: 11.4 g/dL — ABNORMAL LOW (ref 12.0–15.0)
MCH: 26.3 pg (ref 26.0–34.0)
MCV: 82.2 fL (ref 78.0–100.0)
Platelets: 159 10*3/uL (ref 150–400)
RBC: 4.33 MIL/uL (ref 3.87–5.11)
RDW: 20.5 % — ABNORMAL HIGH (ref 11.5–15.5)
WBC: 18.1 10*3/uL — ABNORMAL HIGH (ref 4.0–10.5)

## 2013-07-02 LAB — CBC
HCT: 32.4 % — ABNORMAL LOW (ref 36.0–46.0)
Hemoglobin: 10.6 g/dL — ABNORMAL LOW (ref 12.0–15.0)
RDW: 20.4 % — ABNORMAL HIGH (ref 11.5–15.5)
WBC: 15.9 10*3/uL — ABNORMAL HIGH (ref 4.0–10.5)

## 2013-07-02 LAB — BASIC METABOLIC PANEL
BUN: 22 mg/dL (ref 6–23)
Calcium: 9.1 mg/dL (ref 8.4–10.5)
Chloride: 99 mEq/L (ref 96–112)
Glucose, Bld: 79 mg/dL (ref 70–99)
Potassium: 4.4 mEq/L (ref 3.5–5.1)

## 2013-07-02 LAB — BLOOD GAS, ARTERIAL: pH, Arterial: 7.432 (ref 7.350–7.450)

## 2013-07-05 LAB — MAGNESIUM: Magnesium: 2 mg/dL (ref 1.5–2.5)

## 2013-07-05 LAB — BASIC METABOLIC PANEL
BUN: 16 mg/dL (ref 6–23)
CO2: 24 mEq/L (ref 19–32)
Chloride: 98 mEq/L (ref 96–112)
Creatinine, Ser: 0.51 mg/dL (ref 0.50–1.10)
GFR calc Af Amer: >90 mL/min (ref >90)
GFR calc non Af Amer: >90 mL/min (ref >90)
Potassium: 4.4 mEq/L (ref 3.5–5.1)
Sodium: 133 mEq/L — ABNORMAL LOW (ref 135–145)

## 2013-09-04 ENCOUNTER — Ambulatory Visit (INDEPENDENT_AMBULATORY_CARE_PROVIDER_SITE_OTHER): Payer: Medicare Other | Admitting: Pulmonary Disease

## 2013-09-04 ENCOUNTER — Encounter: Payer: Self-pay | Admitting: Pulmonary Disease

## 2013-09-04 VITALS — BP 104/70 | HR 83 | Temp 99.2°F | Ht 60.0 in | Wt 132.8 lb

## 2013-09-04 DIAGNOSIS — J189 Pneumonia, unspecified organism: Secondary | ICD-10-CM

## 2013-09-04 DIAGNOSIS — R918 Other nonspecific abnormal finding of lung field: Secondary | ICD-10-CM

## 2013-09-04 DIAGNOSIS — J449 Chronic obstructive pulmonary disease, unspecified: Secondary | ICD-10-CM

## 2013-09-04 DIAGNOSIS — F17200 Nicotine dependence, unspecified, uncomplicated: Secondary | ICD-10-CM

## 2013-09-04 DIAGNOSIS — Z720 Tobacco use: Secondary | ICD-10-CM

## 2013-09-04 MED ORDER — VARENICLINE TARTRATE 0.5 MG X 11 & 1 MG X 42 PO MISC: ORAL | Status: DC

## 2013-09-04 MED ORDER — TIOTROPIUM BROMIDE MONOHYDRATE 18 MCG IN CAPS: 18.0000 ug | ORAL_CAPSULE | Freq: Every day | RESPIRATORY_TRACT | Status: DC

## 2013-09-04 NOTE — Assessment & Plan Note (Signed)
COPD: GOLD Grade D Combined recommendations from the [employer], [employer], Designer, television/film set, European Respiratory Society (Qaseem A et al, Ann Intern Med. 2011;155(3):179) recommends tobacco cessation, pulmonary rehab (for symptomatic patients with an FEV1 < 50% predicted), supplemental oxygen (for patients with SaO2 <88% or paO2 <55), and appropriate bronchodilator therapy.  In regards to long acting bronchodilators, they recommend monotherapy (FEV1 60-80% with symptoms weak evidence, FEV1 with symptoms <60% strong evidence), or combination therapy (FEV1 <60% with symptoms, strong recommendation, moderate evidence).  One should also provide patients with annual immunizations and consider therapy for prevention of COPD exacerbations (ie. roflumilast or azithromycin) when appopriate.  -O2 therapy: Not indicated -Immunizations: Flu shot 08/2013 -Tobacco use: encouraged at length to quit -Exercise: encouraged regular exercise -Bronchodilator therapy: Instructed to use spiriva daily; she denies dyspnea, so unclear if adding combination steroid/bronchodilator will make a difference or now -Exacerbation prevention: Spiriva

## 2013-09-04 NOTE — Assessment & Plan Note (Signed)
I counseled her at length today discussing the importance of stopping tobacco immediately. She has very severe COPD and the best thing she can do for herself is to quit smoking. She wants to quit and has cut back to 3-4 cigarettes a day. She has strong family support for this. Her psychiatrist has given her approval for Chantix but unfortunately her insurance company initially denied it. I will write a prescription for it today which she is to start taking immediately. I instructed her to stop taking Wellbutrin but to watch out for side effects from the Chantix such as depression, anxiety, abnormal thoughts, or suicidality. She knows to stop the Chantix immediately and let us know or her psychiatrist know if she experiences this.

## 2013-09-04 NOTE — Patient Instructions (Addendum)
Go get a chest x-ray at the [facility] office, we will call you with the results Quit Smoking! Stop taking the wellbutrin Start chantix 0.5mg  daily for 3 days, then twice a day for the rest of the week. After the first week, take 1mg  twice a day.  On that day, quit smoking.  Stop the medicine immediately if you feel depression, anxiety or suicidal thoughts and let us know Take the Spiriva every day no matter how you feel. We will see you back in one month or sooner if needed

## 2013-09-04 NOTE — Progress Notes (Signed)
Subjective:    Patient ID: Jane Roberts, female    DOB: 12/29/1965, 47 y.o.   MRN: [id number]  HPI This is a very pleasant 47 year old female with a past medical history significant for COPD and bipolar disorder who comes to our clinic today for evaluation of COPD. As a child she had no respiratory illnesses but unfortunately started smoking cigarettes at age 37. At some point in her 30s she was told that she had recurrent bronchitis and was given an albuterol inhaler. She smoked as much as 2 packs a day ever since then. She has been mechanically ventilated twice in the last 3 years for pneumonia and flares of COPD. Most recently, she was intubated and mechanically ventilated at [facility] for an exacerbation of COPD as well as left lower lobe pneumonia. She was discharged home on Spiriva and instructed to followup with me.  She states that since hospital discharge she has been feeling well. She is still smoking however and is currently smoking 3-4 cigarettes a day. She says that her children are very strict about trying to help her cut back. She saw her psychiatrist after the hospital discharge and he approved Chantix. Unfortunately her insurance company has not covered it yet. She has been taking Wellbutrin but unfortunately this has not helped her cut back very much. She still has severe cravings for cigarettes.  She tells me that she really doesn't have much in the way of shortness of breath. She has a cough which is dry most days but some days will be productive of green sputum. She has no wheezing. She has been using the Spiriva only on an as needed basis and doesn't feel that it makes any difference. She has not had a flu shot yet.    Past Medical History  Diagnosis Date  . Chronic headaches   . COPD (chronic obstructive pulmonary disease)      Family History  Problem Relation Age of Onset  . COPD Mother     smoked     History   Social History  . Marital Status: Divorced    Spouse Name: N/A    Number of Children: 1  . Years of Education: N/A   Occupational History  . Disabled    Social History Main Topics  . Smoking status: Current Every Day Smoker -- 0.25 packs/day for 35 years    Types: Cigarettes  . Smokeless tobacco: Never Used  . Alcohol Use: Yes     Comment: rare  . Drug Use: No  . Sexual Activity: Not on file   Other Topics Concern  . Not on file   Social History Narrative  . No narrative on file     No Known Allergies   No outpatient prescriptions prior to visit.   No facility-administered medications prior to visit.      Review of Systems  Constitutional: Positive for unexpected weight change. Negative for fever and chills.  HENT: Positive for sore throat and trouble swallowing. Negative for ear pain, nosebleeds, congestion, rhinorrhea, sneezing, dental problem, voice change, postnasal drip and sinus pressure.   Eyes: Negative for visual disturbance.  Respiratory: Positive for cough and shortness of breath. Negative for choking.   Cardiovascular: Negative for chest pain and leg swelling.  Gastrointestinal: Negative for vomiting, abdominal pain and diarrhea.  Genitourinary: Negative for difficulty urinating.  Musculoskeletal: Negative for arthralgias.  Skin: Negative for rash.  Neurological: Positive for headaches. Negative for tremors and syncope.  Hematological: Does not bruise/bleed easily.  Objective:   Physical Exam  Filed Vitals:   09/04/13 1136  BP: 104/70  Pulse: 83  Temp: 99.2 F (37.3 C)  TempSrc: Oral  Height: 5' (1.524 m)  Weight: 132 lb 12.8 oz (60.238 kg)  SpO2: 90%   Walked 250 feet in the office and her O2saturation dropped ton 88% on Room Air, recovered within seconds of rest  Gen: well appearing, no acute distress HEENT: NCAT, PERRL, EOMi, OP clear, neck supple without masses PULM: poor air movement, some crackles L base, normal percussion CV: RRR, no mgr, no JVD AB: BS+, soft, nontender,  no hsm Ext: warm, no edema, no clubbing, no cyanosis Derm: no rash or skin breakdown Neuro: A&Ox4, CN II-XII intact, strength 5/5 in all 4 extremities       Assessment & Plan:   COPD (chronic obstructive pulmonary disease) COPD: GOLD Grade D Combined recommendations from the [employer], [employer], Designer, television/film set, European Respiratory Society (Qaseem A et al, Ann Intern Med. 2011;155(3):179) recommends tobacco cessation, pulmonary rehab (for symptomatic patients with an FEV1 < 50% predicted), supplemental oxygen (for patients with SaO2 <88% or paO2 <55), and appropriate bronchodilator therapy.  In regards to long acting bronchodilators, they recommend monotherapy (FEV1 60-80% with symptoms weak evidence, FEV1 with symptoms <60% strong evidence), or combination therapy (FEV1 <60% with symptoms, strong recommendation, moderate evidence).  One should also provide patients with annual immunizations and consider therapy for prevention of COPD exacerbations (ie. roflumilast or azithromycin) when appopriate.  -O2 therapy: Not indicated -Immunizations: Flu shot 08/2013 -Tobacco use: encouraged at length to quit -Exercise: encouraged regular exercise -Bronchodilator therapy: Instructed to use spiriva daily; she denies dyspnea, so unclear if adding combination steroid/bronchodilator will make a difference or now -Exacerbation prevention: Spiriva   Pulmonary infiltrate Repeat chest x-ray ordered to ensure pneumonia from July 2014 has cleared up.  Tobacco abuse I counseled her at length today discussing the importance of stopping tobacco immediately. She has very severe COPD and the best thing she can do for herself is to quit smoking. She wants to quit and has cut back to 3-4 cigarettes a day. She has strong family support for this. Her psychiatrist has given her approval for Chantix but unfortunately her insurance company initially denied it.  I will write a prescription for it today which she is to start taking immediately. I instructed her to stop taking Wellbutrin but to watch out for side effects from the Chantix such as depression, anxiety, abnormal thoughts, or suicidality. She knows to stop the Chantix immediately and let us know or her psychiatrist know if she experiences this.   Updated Medication List Outpatient Encounter Prescriptions as of 09/04/2013  Medication Sig Dispense Refill  . albuterol (PROVENTIL) (2.5 MG/3ML) 0.083% nebulizer solution Take 2.5 mg by nebulization every 6 (six) hours as needed for wheezing or shortness of breath.      . gabapentin (NEURONTIN) 300 MG capsule Take 300 mg by mouth 3 (three) times daily.      Marland Kitchen gabapentin (NEURONTIN) 400 MG capsule Take 400 mg by mouth 3 (three) times daily.      Marland Kitchen LORazepam (ATIVAN) 1 MG tablet Take 1 mg by mouth 4 (four) times daily.      . mirtazapine (REMERON) 15 MG tablet Take 15 mg by mouth at bedtime.      Marland Kitchen tiotropium (SPIRIVA) 18 MCG inhalation capsule Place 1 capsule (18 mcg total) into inhaler and inhale daily.  30 capsule  3  . [DISCONTINUED] tiotropium (SPIRIVA) 18 MCG inhalation capsule Place 18 mcg into inhaler and inhale daily.      . varenicline (CHANTIX PAK) 0.5 MG X 11 & 1 MG X 42 tablet Take one 0.5 mg tablet by mouth once daily for 3 days, then increase to one 0.5 mg tablet twice daily for 4 days, then increase to one 1 mg tablet twice daily.  53 tablet  1   No facility-administered encounter medications on file as of 09/04/2013.

## 2013-09-04 NOTE — Assessment & Plan Note (Signed)
Repeat chest x-ray ordered to ensure pneumonia from July 2014 has cleared up.

## 2013-09-20 ENCOUNTER — Ambulatory Visit (INDEPENDENT_AMBULATORY_CARE_PROVIDER_SITE_OTHER)
Admission: RE | Admit: 2013-09-20 | Discharge: 2013-09-20 | Disposition: A | Discharge status: Home / Self Care | Payer: Medicare Other | Source: Ambulatory Visit | Attending: Pulmonary Disease | Admitting: Pulmonary Disease

## 2013-09-20 DIAGNOSIS — J189 Pneumonia, unspecified organism: Secondary | ICD-10-CM

## 2013-09-27 ENCOUNTER — Ambulatory Visit: Payer: Self-pay | Admitting: Family Medicine

## 2013-10-02 ENCOUNTER — Ambulatory Visit (INDEPENDENT_AMBULATORY_CARE_PROVIDER_SITE_OTHER): Payer: Medicare Other | Admitting: Pulmonary Disease

## 2013-10-02 ENCOUNTER — Encounter: Payer: Self-pay | Admitting: Pulmonary Disease

## 2013-10-02 VITALS — BP 90/62 | HR 91 | Temp 98.3°F | Ht 60.0 in | Wt 132.0 lb

## 2013-10-02 DIAGNOSIS — J449 Chronic obstructive pulmonary disease, unspecified: Secondary | ICD-10-CM

## 2013-10-02 DIAGNOSIS — Z720 Tobacco use: Secondary | ICD-10-CM

## 2013-10-02 DIAGNOSIS — F17200 Nicotine dependence, unspecified, uncomplicated: Secondary | ICD-10-CM

## 2013-10-02 MED ORDER — FLUTICASONE-SALMETEROL 250-50 MCG/DOSE IN AEPB: 1.0000 | INHALATION_SPRAY | Freq: Two times a day (BID) | RESPIRATORY_TRACT | Status: DC

## 2013-10-02 NOTE — Assessment & Plan Note (Signed)
Continue to stay away from cigarettes.  Congratulated her today.

## 2013-10-02 NOTE — Assessment & Plan Note (Signed)
This has been a stable interval for Jane Roberts.    Plan: -continue Advair (she has been taking this, not Spiriva) -f/u six months

## 2013-10-02 NOTE — Progress Notes (Signed)
Subjective:    Patient ID: Jane Roberts, female    DOB: Aug 10, 1966, 47 y.o.   MRN: [id number]  Synopsis: This is a very pleasant female with COPD who first saw the [place] [facility] in 08/2013 after she was intubated for a COPD exacerbation earlier in the year. She had smoked sas as much as 2 packs a day since age 30 and had been intubated x2 for exacerbations of COPD and pneumonia , most recently July 2014 Started Spriva July 2014. September 2014 simple spirometry ratio 58%, FEV1 1.0 L (40% predicted) 08/2013 Walk 250 feet in office on room air oxygen saturation dropped to 88% and recovered quickly  HPI  10/02/2013 ROV >  Jane Roberts says that she is not having any shortness of breath. She is producing clear sputum eery day.  She has occassional chills.  She continues to take Advair.  She is on Chantix right now and has only smoked one cigarette this week.  She saw her psychiatrist yesterday and he prescribed more chantix.    Past Medical History  Diagnosis Date  . Chronic headaches   . COPD (chronic obstructive pulmonary disease)      Review of Systems     Objective:   Physical Exam Filed Vitals:   10/02/13 1409  BP: 90/62  Pulse: 91  Temp: 98.3 F (36.8 C)  TempSrc: Oral  Height: 5' (1.524 m)  Weight: 132 lb (59.875 kg)  SpO2: 92%   Gen: well appearing, no acute distress HEENT: NCAT,  EOMi,  PULM: CTA B CV: RRR, no mgr, no JVD AB: soft, nontender, no hsm Ext: warm, no edema, no clubbing, no cyanosis        Assessment & Plan:   COPD (chronic obstructive pulmonary disease) This has been a stable interval for Jane Roberts.    Plan: -continue Advair (she has been taking this, not Spiriva) -f/u six months  Tobacco abuse Continue to stay away from cigarettes.  Congratulated her today.   Updated Medication List Outpatient Encounter Prescriptions as of 10/02/2013  Medication Sig Dispense Refill  . esomeprazole (NEXIUM) 40 MG capsule Take 40 mg by mouth daily before  breakfast.      . Fluticasone-Salmeterol (ADVAIR) 250-50 MCG/DOSE AEPB Inhale 1 puff into the lungs every 12 (twelve) hours.  60 each  5  . gabapentin (NEURONTIN) 300 MG capsule Take 300 mg by mouth 3 (three) times daily.      Marland Kitchen gabapentin (NEURONTIN) 400 MG capsule Take 400 mg by mouth 3 (three) times daily.      Marland Kitchen LORazepam (ATIVAN) 1 MG tablet Take 1 mg by mouth 4 (four) times daily.      . Lurasidone HCl (LATUDA) 60 MG TABS Take 1 tablet by mouth at bedtime.      . mirtazapine (REMERON) 15 MG tablet Take 15 mg by mouth at bedtime.      . risperiDONE (RISPERDAL) 0.25 MG tablet Take 0.25 mg by mouth daily.      . varenicline (CHANTIX PAK) 0.5 MG X 11 & 1 MG X 42 tablet Take one 0.5 mg tablet by mouth once daily for 3 days, then increase to one 0.5 mg tablet twice daily for 4 days, then increase to one 1 mg tablet twice daily.  53 tablet  1  . [DISCONTINUED] Fluticasone-Salmeterol (ADVAIR) 250-50 MCG/DOSE AEPB Inhale 1 puff into the lungs every 12 (twelve) hours.      Marland Kitchen albuterol (PROVENTIL) (2.5 MG/3ML) 0.083% nebulizer solution Take 2.5 mg by nebulization every 6 (  six) hours as needed for wheezing or shortness of breath.      . [DISCONTINUED] tiotropium (SPIRIVA) 18 MCG inhalation capsule Place 1 capsule (18 mcg total) into inhaler and inhale daily.  30 capsule  3   No facility-administered encounter medications on file as of 10/02/2013.

## 2013-10-02 NOTE — Patient Instructions (Signed)
Continue to take the Advair twice a day Stay away from cigarettes! We will see you back in 6 months or sooner if needed

## 2013-10-05 ENCOUNTER — Telehealth: Payer: Self-pay | Admitting: Pulmonary Disease

## 2013-10-05 NOTE — Telephone Encounter (Signed)
BQ pt is requesting a refill of the chantix.  Please advise. Thanks  Allergies  Allergen Reactions  . Haldol [Haloperidol Lactate]   . Penicillins

## 2013-10-08 MED ORDER — VARENICLINE TARTRATE 1 MG PO TABS: 1.0000 mg | ORAL_TABLET | Freq: Two times a day (BID) | ORAL | Status: DC

## 2013-10-08 NOTE — Telephone Encounter (Signed)
lmomtcb x1 rx sent 

## 2013-10-08 NOTE — Telephone Encounter (Signed)
Fine by me 

## 2013-10-09 NOTE — Telephone Encounter (Signed)
Pt aware. Lakiesha Ralphs, CMA  

## 2014-01-30 ENCOUNTER — Telehealth: Payer: Self-pay | Admitting: Pulmonary Disease

## 2014-01-30 MED ORDER — VARENICLINE TARTRATE 1 MG PO TABS: 1.0000 mg | ORAL_TABLET | Freq: Two times a day (BID) | ORAL | Status: DC

## 2014-01-30 NOTE — Telephone Encounter (Signed)
lmomtcb x1 for pt rx sent 

## 2014-01-31 NOTE — Telephone Encounter (Signed)
lmtcb x2 

## 2014-02-04 NOTE — Telephone Encounter (Signed)
Refill sent. Jennifer Castillo, CMA  

## 2014-03-26 ENCOUNTER — Telehealth: Payer: Self-pay

## 2014-03-26 NOTE — Telephone Encounter (Signed)
Yes please

## 2014-03-26 NOTE — Telephone Encounter (Signed)
I spoke with pt. She has been on chantix for couple months now.  She is still smoking about 1 big 3 times a week.  Please advise BQ if you want us to start PA? THANKS

## 2014-03-26 NOTE — Telephone Encounter (Signed)
Pt returned call.  Pt is interested in taking Chantix.  Pt can be reached at [phone].  Antionette FairyHolly D Pryor

## 2014-03-26 NOTE — Telephone Encounter (Signed)
Received a PA for chantix for this pt.  LMTCB X1 for pt to see if she is requesting this medication.  It is generally taken for only 12 weeks.  Will wait to hear back from pt before initiating PA.

## 2014-03-28 ENCOUNTER — Other Ambulatory Visit: Payer: Self-pay

## 2014-03-28 MED ORDER — FLUTICASONE-SALMETEROL 250-50 MCG/DOSE IN AEPB: 1.0000 | INHALATION_SPRAY | Freq: Two times a day (BID) | RESPIRATORY_TRACT | Status: DC

## 2014-03-28 NOTE — Telephone Encounter (Signed)
PA initiated and form given to Jane Roberts to have BQ sing and then fax form in.

## 2014-04-11 ENCOUNTER — Other Ambulatory Visit: Payer: Self-pay

## 2014-04-11 MED ORDER — VARENICLINE TARTRATE 1 MG PO TABS: 1.0000 mg | ORAL_TABLET | Freq: Two times a day (BID) | ORAL | Status: DC

## 2014-04-11 NOTE — Telephone Encounter (Signed)
Chantix has been approved through [employer].  Pt and pharmacy are both aware.  Nothing further needed.

## 2014-04-16 ENCOUNTER — Ambulatory Visit (INDEPENDENT_AMBULATORY_CARE_PROVIDER_SITE_OTHER): Payer: Medicare Other | Admitting: Pulmonary Disease

## 2014-04-16 ENCOUNTER — Encounter: Payer: Self-pay | Admitting: Pulmonary Disease

## 2014-04-16 VITALS — BP 110/72 | HR 89 | Ht 60.0 in | Wt 149.0 lb

## 2014-04-16 DIAGNOSIS — J449 Chronic obstructive pulmonary disease, unspecified: Secondary | ICD-10-CM

## 2014-04-16 DIAGNOSIS — Z720 Tobacco use: Secondary | ICD-10-CM

## 2014-04-16 DIAGNOSIS — F17200 Nicotine dependence, unspecified, uncomplicated: Secondary | ICD-10-CM

## 2014-04-16 MED ORDER — TIOTROPIUM BROMIDE MONOHYDRATE 18 MCG IN CAPS: 18.0000 ug | ORAL_CAPSULE | Freq: Every day | RESPIRATORY_TRACT | Status: DC

## 2014-04-16 NOTE — Patient Instructions (Addendum)
Take the chantix to stop smoking all together Stop advair Use Chantix one puff per day no matter how you feel Get a flu shot in the fall We will see you back in 6 months or sooner if neededReu

## 2014-04-16 NOTE — Assessment & Plan Note (Signed)
This has been a stable interval for Jane StanleyLisa She wants to take a once a day inhaler  Plan: -switch advair to Spiriva -flu shot in the fall -f/u 6 months

## 2014-04-16 NOTE — Assessment & Plan Note (Signed)
Resume chantix Quit smoking altogether, advised today

## 2014-04-16 NOTE — Progress Notes (Signed)
  Subjective:    Patient ID: Jane Roberts, female    DOB: 1966/06/28, 48 y.o.   MRN: [id number]  Synopsis: This is a very pleasant female with COPD who first saw the [place] [facility] in 08/2013 after she was intubated for a COPD exacerbation earlier in the year. She had smoked sas as much as 2 packs a day since age 48 and had been intubated x2 for exacerbations of COPD and pneumonia , most recently July 2014 Started Spriva July 2014. September 2014 simple spirometry ratio 58%, FEV1 1.0 L (40% predicted) 08/2013 Walk 250 feet in office on room air oxygen saturation dropped to 88% and recovered quickly  HPI   04/16/2014 ROV> Sya says her breathing is doing OK.  No cough.  She hasn't started taking the Chantix yet.  She would like to take a once a day inhaler because she can't remember to take the second dose.  Still smoking one cigarette a day, down from a pack. She hasn't taken the second round of chantix yet.   Not coughing.    Past Medical History  Diagnosis Date  . Chronic headaches   . COPD (chronic obstructive pulmonary disease)      Review of Systems      Objective:   Physical Exam  Filed Vitals:   04/16/14 1439  BP: 110/72  Pulse: 89  Height: 5' (1.524 m)  Weight: 149 lb (67.586 kg)  SpO2: 92%  RA  Gen: well appearing, no acute distress HEENT: NCAT,  EOMi,  PULM: CTA B CV: RRR, no mgr, no JVD AB: soft, nontender, no hsm Ext: warm, no edema, no clubbing, no cyanosis      Assessment & Plan:   COPD (chronic obstructive pulmonary disease) This has been a stable interval for Misty StanleyLisa She wants to take a once a day inhaler  Plan: -switch advair to Spiriva -flu shot in the fall -f/u 6 months  Tobacco abuse Resume chantix Quit smoking altogether, advised today    Updated Medication List Outpatient Encounter Prescriptions as of 04/16/2014  Medication Sig  . albuterol (PROVENTIL) (2.5 MG/3ML) 0.083% nebulizer solution Take 2.5 mg by nebulization every 6 (six)  hours as needed for wheezing or shortness of breath.  . esomeprazole (NEXIUM) 40 MG capsule Take 40 mg by mouth daily before breakfast.  . Fluticasone-Salmeterol (ADVAIR) 250-50 MCG/DOSE AEPB Inhale 1 puff into the lungs every 12 (twelve) hours.  . gabapentin (NEURONTIN) 300 MG capsule Take 300 mg by mouth 3 (three) times daily.  Marland Kitchen. gabapentin (NEURONTIN) 400 MG capsule Take 400 mg by mouth 3 (three) times daily.  Marland Kitchen. LORazepam (ATIVAN) 1 MG tablet Take 1 mg by mouth 4 (four) times daily.  . Lurasidone HCl (LATUDA) 60 MG TABS Take 1 tablet by mouth at bedtime.  . mirtazapine (REMERON) 15 MG tablet Take 15 mg by mouth at bedtime.  . risperiDONE (RISPERDAL) 0.25 MG tablet Take 0.25 mg by mouth daily.  . varenicline (CHANTIX CONTINUING MONTH PAK) 1 MG tablet Take 1 tablet (1 mg total) by mouth 2 (two) times daily.  . [DISCONTINUED] varenicline (CHANTIX PAK) 0.5 MG X 11 & 1 MG X 42 tablet Take one 0.5 mg tablet by mouth once daily for 3 days, then increase to one 0.5 mg tablet twice daily for 4 days, then increase to one 1 mg tablet twice daily.

## 2014-05-14 ENCOUNTER — Encounter: Payer: Self-pay | Admitting: *Deleted

## 2014-05-15 ENCOUNTER — Telehealth: Payer: Self-pay | Admitting: Neurology

## 2014-05-15 NOTE — Telephone Encounter (Signed)
Pt called stating that she will be in the office today around 10am to fill out her release of info form.

## 2014-05-20 ENCOUNTER — Encounter: Payer: Self-pay | Admitting: Neurology

## 2014-05-20 ENCOUNTER — Ambulatory Visit (INDEPENDENT_AMBULATORY_CARE_PROVIDER_SITE_OTHER): Payer: Medicare Other | Admitting: Neurology

## 2014-05-20 VITALS — BP 102/70 | HR 68 | Temp 98.2°F | Resp 18 | Ht 60.0 in | Wt 151.2 lb

## 2014-05-20 DIAGNOSIS — G4440 Drug-induced headache, not elsewhere classified, not intractable: Secondary | ICD-10-CM

## 2014-05-20 DIAGNOSIS — G43009 Migraine without aura, not intractable, without status migrainosus: Secondary | ICD-10-CM

## 2014-05-20 MED ORDER — TOPIRAMATE 25 MG PO TABS: 25.0000 mg | ORAL_TABLET | Freq: Every day | ORAL | Status: AC

## 2014-05-20 MED ORDER — DICLOFENAC SODIUM 50 MG PO TBEC: DELAYED_RELEASE_TABLET | ORAL | Status: DC

## 2014-05-20 NOTE — Progress Notes (Signed)
NEUROLOGY CONSULTATION NOTE  Rayssa Atha MRN: [id number] DOB: Dec 24, 1965  Referring provider: Dr. Hessie Diener Primary care provider: Dr. Hessie Diener  Reason for consult:  headache  HISTORY OF PRESENT ILLNESS: Jane Roberts is a 48 year old right-handed woman with history of chronic pain (formerly treated with narcotics), hypothyroidism, COPD, tobacco abuse, substance abuse, provoked seizure due to klonopin withdrawal, macrocytic anemia, folate deficiency, and Bipolar disorder who presents for headache.  Outside records personally reviewed.  Onset:  48 years old Location:  Right-sided (starts right posterior parietal region and radiates to both eyes) Quality:  spasm Intensity:  8/10 migraines, 2-3/10 daily headache Aura:  no Prodrome:  no Associated symptoms:  Migraine with nausea, shakiness, chills, photophobia, phonophobia, osmophobia, blurred vision.  No autonomic symptoms. Duration:  Migraines are 3 days.  Otherwise, has daily headache Frequency:  Migraines occur 2-3 times a month (9 migraine days per month).  Otherwise, daily headache Triggers/exacerbating factors:  no Relieving factors:  Cool wash clothe over eyes Activity:  Unable to function with migraines  Past abortive therapy:  Ibuprofen (ineffective), naproxen (ineffective), Excedrin (ineffective), Tylenol (ineffective), Imitrex (unsusual side effect of paralysis), hydrocodone/oxycodone (addiction) Past preventative therapy:  right C2 gnaglectomy (1994- made headaches worse), depakote (hair loss), nortriptyline (no longer effective; also Bipolar), amitriptyline (no longer effective; also Bipolar) Allergies:  Effexor, PCN, Vioxx, Talwin, Baflin, Toradol, Haldol, beta blockers, tizanidine  Current abortive therapy:  Marijuana (only when has migraine), BC (daily)  Current preventative therapy:  gabapentin 800mg  three times daily  Caffeine:  Pepsi and tea daily Alcohol:  occasionally Smoker:  Yes, cut down to 1 cigarette a day.  Smokes  marijuana when has a migraine Diet:  Eats one meal a day. Exercise:  no Depression/stress:  Stress, particularly about caring for her two young grandchildren who live with her. Sleep hygiene:  Poor.  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  No history of stroke or TIA. Family history of headache:  Father (also aortic aneurysm), paternal grandmother (also intracranial aneurysm)  06/19/13 CT HEAD WO:  mild cerebral atrophy  PAST MEDICAL HISTORY: Past Medical History  Diagnosis Date  . Chronic headaches   . COPD (chronic obstructive pulmonary disease)   . Seizures   . Anemia   . COPD (chronic obstructive pulmonary disease)   . Hypothyroidism     PAST SURGICAL HISTORY: Past Surgical History  Procedure Laterality Date  . Nasal sinus surgery      1990    MEDICATIONS: Current Outpatient Prescriptions on File Prior to Visit  Medication Sig Dispense Refill  . albuterol (PROVENTIL) (2.5 MG/3ML) 0.083% nebulizer solution Take 2.5 mg by nebulization every 6 (six) hours as needed for wheezing or shortness of breath.      . esomeprazole (NEXIUM) 40 MG capsule Take 40 mg by mouth daily before breakfast.      . gabapentin (NEURONTIN) 300 MG capsule Take 300 mg by mouth 3 (three) times daily.      Marland Kitchen gabapentin (NEURONTIN) 400 MG capsule Take 400 mg by mouth 3 (three) times daily.      Marland Kitchen LORazepam (ATIVAN) 1 MG tablet Take 1 mg by mouth 4 (four) times daily.      . Lurasidone HCl (LATUDA) 60  MG TABS Take 1 tablet by mouth at bedtime.      . mirtazapine (REMERON) 15 MG tablet Take 15 mg by mouth at bedtime.      . risperiDONE (RISPERDAL) 0.25 MG tablet Take 0.25 mg by mouth daily.        Marland Kitchen. tiotropium (SPIRIVA HANDIHALER) 18 MCG inhalation capsule Place 1 capsule (18 mcg total) into inhaler and inhale daily.  30 capsule  5  . varenicline (CHANTIX CONTINUING MONTH PAK) 1 MG tablet Take 1 tablet (1 mg total) by mouth 2 (two) times daily.  60 tablet  1   No current facility-administered medications on file prior to visit.    ALLERGIES: Allergies  Allergen Reactions  . Haldol [Haloperidol Lactate]   . Penicillins     FAMILY HISTORY: Family History  Problem Relation Age of Onset  . COPD Mother     smoked  . Heart failure Maternal Grandmother   . Diabetes Maternal Grandmother     SOCIAL HISTORY: History   Social History  . Marital Status: Divorced    Spouse Name: N/A    Number of Children: 1  . Years of Education: N/A   Occupational History  . Disabled    Social History Main Topics  . Smoking status: Current Every Day Smoker -- 0.10 packs/day for 35 years    Types: Cigarettes  . Smokeless tobacco: Never Used     Comment: down to 1 cig/day 5.5.15  . Alcohol Use: Yes     Comment: rare  . Drug Use: Yes    Special: Marijuana  . Sexual Activity: No   Other Topics Concern  . Not on file   Social History Narrative  . No narrative on file    REVIEW OF SYSTEMS: Constitutional: No fevers, chills, or sweats, no generalized fatigue, change in appetite Eyes: No visual changes, double vision, eye pain Ear, nose and throat: No hearing loss, ear pain, nasal congestion, sore throat Cardiovascular: No chest pain, palpitations Respiratory:  No shortness of breath at rest or with exertion, wheezes GastrointestinaI: No nausea, vomiting, diarrhea, abdominal pain, fecal incontinence Genitourinary:  No dysuria, urinary retention or frequency Musculoskeletal:  No neck pain, back pain Integumentary: No rash, pruritus, skin lesions Neurological: as above Psychiatric: No depression, insomnia, anxiety Endocrine: No palpitations, fatigue, diaphoresis, mood swings, change  in appetite, change in weight, increased thirst Hematologic/Lymphatic:  No anemia, purpura, petechiae. Allergic/Immunologic: no itchy/runny eyes, nasal congestion, recent allergic reactions, rashes  PHYSICAL EXAM: Filed Vitals:   05/20/14 1049  BP: 102/70  Pulse: 68  Temp: 98.2 F (36.8 C)  Resp: 18   General: No acute distress Head:  Normocephalic/atraumatic Neck: supple, no paraspinal tenderness, full range of motion Back: No paraspinal tenderness Heart: regular rate and rhythm Lungs: Clear to auscultation bilaterally. Vascular: No carotid bruits. Neurological Exam: Mental status: alert and oriented to person, place, and time, recent and remote memory intact, fund of knowledge intact, attention and concentration intact, speech fluent and not dysarthric, language intact. Cranial nerves: CN I: not tested CN II: pupils equal, round and reactive to light, visual fields intact, fundi unremarkable, without vessel changes, exudates, hemorrhages or papilledema. CN III, IV, VI:  full range of motion, no nystagmus, no ptosis CN V: facial sensation intact CN VII: upper and lower face symmetric CN VIII: hearing intact CN IX, X: gag intact, uvula midline CN XI: sternocleidomastoid and trapezius muscles intact CN XII: tongue midline Bulk & Tone: normal, no fasciculations. Motor: 5/5 throughout Sensation: temperature and  vibration intact Deep Tendon Reflexes: 2+ throughout, toes downgoing Finger to nose testing: no dysmetria Heel to shin: no dysmetria Gait: normal station and stride.  Able to turn.  Walks in tandem although a little unsteady. Romberg negative.  IMPRESSION: Migraine without aura Medication overuse headache Challenging due to limited options:  Would not prescribe antidepressants due to Bipolar, caution with antihypertensive medications due to low blood pressure, had reaction to triptan  PLAN: 1.  Start topamax 25mg  at bedtime.  Side effects discussed. 2.  For abortive  therapy, will try diclofenac sodium 50-100mg .   3.  Limit use of all pain relievers to no more than 2 days out of the week 4.  Recommend stopping BC, caffeine, cigarettes 5.  Start routine exercise and improve diet 6.  Call in 4 weeks with update.  Follow up in 3 months.  Thank you for allowing me to take part in the care of this patient.  Shon Millet, DO  CC:  Tonia Ghent, MD

## 2014-05-20 NOTE — Patient Instructions (Signed)
Migraine Recommendations: 1.  At earliest onset of migraine, take diclofenac 1 to 2 tablets at immediate onset of migraine. 2.  Limit use of pain relievers to no more than 2 days out of the week.  These medications include acetaminophen, ibuprofen, triptans and narcotics.  This will help reduce risk of rebound headaches. 3.  Keep a headache diary. 4.  Stay adequately hydrated. 5.  Maintain good sleep hygiene. 6.  Maintain proper stress management. 7.  Start improving diet and exercise 8.  Start topiramate (topamax) 25mg  daily at bedtime.  Possible side effects include: impaired thinking, sedation, paresthesias (numbness and tingling) and weight loss.  It may cause dehydration and there is a small risk for kidney stones, so make sure to stay hydrated with water during the day.  There is also a very small risk for glaucoma, so if you notice any change in your vision while taking this medication, see an ophthalmologist.  There is also a very small risk of possible suicidal ideation, as it the case with all antiepileptic medications. 9.  Stop caffeine and stop BC. 10.  Follow up in 3 months but call in 4 weeks with update and we can adjust topamax if needed.

## 2014-05-28 ENCOUNTER — Telehealth: Payer: Self-pay | Admitting: Neurology

## 2014-05-28 NOTE — Telephone Encounter (Signed)
Please call pt regarding a question about meds. CB# [phone] / Sherri S.

## 2014-05-29 ENCOUNTER — Telehealth: Payer: Self-pay | Admitting: *Deleted

## 2014-05-29 ENCOUNTER — Telehealth: Payer: Self-pay | Admitting: Neurology

## 2014-05-29 NOTE — Telephone Encounter (Signed)
Pt called again today 05/29/14 at 10:25AM f/u on the message she sent on 05/28/14 regarding her meds. Please call pt back at [phone]

## 2014-05-29 NOTE — Telephone Encounter (Signed)
Patient called stating headache is not better and voltaren is not working is there something else you can give her ? Please advise

## 2014-05-29 NOTE — Telephone Encounter (Signed)
Requesting return call regarding meds. CB# [phone] / Sherri S.

## 2014-05-29 NOTE — Telephone Encounter (Signed)
I explained to the Jane Roberts  As instructed per Dr Everlena CooperJaffe instruction in the below note

## 2014-05-29 NOTE — Telephone Encounter (Signed)
As we had discussed at the recent visit, it will take some time for her headaches to improve. She was taking BC powder daily. I have instructed her to stop this. I stopping the pain medication, I had told her that the headaches could potentially get worse initially. We just started the Topamax and it will take some time for it to kick in. She is to call in 4 weeks after starting the medication and we could further titrate the Topamax if needed. This will take some time and unfortunately he may not be able to find a medication that will stop the headache immediately. She will have to be patient. She has had daily headaches for quite some time and an abortive medication might not be effective at this time until we can get the frequency of these headaches down.

## 2014-05-29 NOTE — Telephone Encounter (Signed)
Patient called stating she woke up with a headache and is nauseated  She states the Voltaten is not working at all and how many can she take or is there something else she can take with it ? Please advise

## 2014-06-20 ENCOUNTER — Telehealth: Payer: Self-pay | Admitting: Neurology

## 2014-06-20 ENCOUNTER — Other Ambulatory Visit: Payer: Self-pay | Admitting: *Deleted

## 2014-06-20 DIAGNOSIS — R51 Headache: Secondary | ICD-10-CM

## 2014-06-20 MED ORDER — TOPIRAMATE 25 MG PO TABS: 25.0000 mg | ORAL_TABLET | Freq: Two times a day (BID) | ORAL | Status: AC

## 2014-06-20 MED ORDER — DICLOFENAC SODIUM 50 MG PO TBEC: DELAYED_RELEASE_TABLET | ORAL | Status: AC

## 2014-06-20 NOTE — Telephone Encounter (Signed)
Pt wants a rx to be called in for her migraines

## 2014-06-20 NOTE — Telephone Encounter (Signed)
Patient  Is aware of medication increase and a new RX was sent to pharmacy

## 2014-06-20 NOTE — Telephone Encounter (Signed)
CB [phone]. Pt calling back with med update, she has been on it for one month now. / Jane Roberts,.

## 2014-06-20 NOTE — Telephone Encounter (Signed)
I would increase topamax to 50mg  at bedtime.  As we discussed, we will have to give each dose and medication a fair chance.  She should update us in 4 weeks and we can make further adjustments if needed.

## 2014-06-20 NOTE — Telephone Encounter (Signed)
Patient calling stating Topamax 25 mg at bedtime is not helping she is still waking up during the night with a headache . Please advise

## 2014-06-27 ENCOUNTER — Telehealth: Payer: Self-pay | Admitting: Neurology

## 2014-06-27 NOTE — Telephone Encounter (Signed)
Migraines, wants something called in CB# [phone] / Sherri S.

## 2014-06-27 NOTE — Telephone Encounter (Signed)
Please advise 

## 2014-06-28 ENCOUNTER — Telehealth: Payer: Self-pay | Admitting: Neurology

## 2014-06-28 ENCOUNTER — Ambulatory Visit (INDEPENDENT_AMBULATORY_CARE_PROVIDER_SITE_OTHER): Payer: Medicare Other | Admitting: *Deleted

## 2014-06-28 DIAGNOSIS — R51 Headache: Secondary | ICD-10-CM

## 2014-06-28 MED ORDER — METOCLOPRAMIDE HCL 5 MG/ML IJ SOLN: 10.0000 mg | Freq: Once | INTRAMUSCULAR | Status: DC

## 2014-06-28 MED ORDER — METOCLOPRAMIDE HCL 5 MG/ML IJ SOLN
10.0000 mg | Freq: Once | INTRAMUSCULAR | Status: AC
  Administered 2014-06-28: 10 mg via INTRAMUSCULAR

## 2014-06-28 MED ORDER — DIPHENHYDRAMINE HCL 50 MG/ML IJ SOLN
25.0000 mg | Freq: Once | INTRAMUSCULAR | Status: AC
  Administered 2014-06-28: 25 mg via INTRAMUSCULAR

## 2014-06-28 MED ORDER — METOCLOPRAMIDE HCL 5 MG/ML IJ SOLN: 10.0000 mg | Freq: Once | INTRAVENOUS | Status: DC

## 2014-06-28 MED ORDER — KETOROLAC TROMETHAMINE 60 MG/2ML IM SOLN
60.0000 mg | Freq: Once | INTRAMUSCULAR | Status: AC
  Administered 2014-06-28: 60 mg via INTRAMUSCULAR

## 2014-06-28 NOTE — Telephone Encounter (Signed)
When a patient calls for migraines, we need to know: 1.  Is the abortive therapy working? 2.  Are the headaches still just as frequent.  1.  For abortive therapy, we prescribed diclofenac.  If this is ineffective in breaking her migraines, we can prescribe tramadol/acetaminophen 37.5/325.  She can take 2 tablets once at earliest onset of headache.  We can give her 20 tablets.  She was over-medicating when I last saw her.  She should not be taking more than 2 days out of the week to prevent rebound headache.  By limiting the use of pain relievers, she should expect that the migraines may get worse while she is going through a "withdrawal" period.  But this should improve.  2.  We just increased the Topamax to 50mg  last week. We need to give each dose 4 weeks before increasing the dose further.

## 2014-06-28 NOTE — Telephone Encounter (Signed)
Left message on patients voicemail to return call office

## 2014-06-28 NOTE — Telephone Encounter (Signed)
Per Dr Everlena Cooperjaffe Injection was given for headache

## 2014-06-28 NOTE — Telephone Encounter (Signed)
Pt needs to talk with you again please call [phone]

## 2014-06-28 NOTE — Telephone Encounter (Signed)
I would have her come in for Toradol 60mg /Benadryl 25mg /Reglan 10mg  shot anyway.

## 2014-07-02 ENCOUNTER — Telehealth: Payer: Self-pay | Admitting: Neurology

## 2014-07-02 NOTE — Telephone Encounter (Signed)
She states that the Toradol, Reglan, Benadryl Injections on 7/17 only helped for a short while. She is still having a migraine. She states she is taking her Topamax. Please advise.

## 2014-07-02 NOTE — Telephone Encounter (Signed)
Patient was notified of advisement. She wants to try another round of injections. She is going to call back tomorrow to schedule a nurse visit once she gets transportation arranged.

## 2014-07-02 NOTE — Telephone Encounter (Signed)
I don't have anything that will eliminate her headaches right away.  She has chronic headaches and rebound headaches.  It will take some time to get it under control (even weeks).  She can come in for another cocktail of Decadron 4mg  IM, Benadryl 25mg  IM and Reglan 10mg  IM.  Otherwise she will have to go to the ED

## 2014-07-02 NOTE — Telephone Encounter (Signed)
Pt needs to talk to someone she states that she has a migraine and cant get rid of it please  Call [phone]

## 2014-07-04 ENCOUNTER — Ambulatory Visit (INDEPENDENT_AMBULATORY_CARE_PROVIDER_SITE_OTHER): Payer: Medicare Other | Admitting: Family Medicine

## 2014-07-04 DIAGNOSIS — R51 Headache: Secondary | ICD-10-CM

## 2014-07-04 MED ORDER — DEXAMETHASONE SODIUM PHOSPHATE 100 MG/10ML IJ SOLN
4.0000 mg | Freq: Once | INTRAMUSCULAR | Status: AC
  Administered 2014-07-04: 4 mg via INTRAMUSCULAR

## 2014-07-04 MED ORDER — METOCLOPRAMIDE HCL 5 MG/ML IJ SOLN
10.0000 mg | Freq: Once | INTRAMUSCULAR | Status: AC
  Administered 2014-07-04: 10 mg via INTRAMUSCULAR

## 2014-07-04 MED ORDER — DIPHENHYDRAMINE HCL 50 MG/ML IJ SOLN
25.0000 mg | Freq: Once | INTRAMUSCULAR | Status: AC
  Administered 2014-07-04: 25 mg via INTRAMUSCULAR

## 2014-07-04 NOTE — Telephone Encounter (Signed)
Returned patient's call. She wanted to proceed with getting injections today. Nurse only appt scheduled for this afternoon.

## 2014-07-04 NOTE — Telephone Encounter (Signed)
Pt wants to talk to someone about coming in for injection please call [phone]

## 2014-07-29 ENCOUNTER — Telehealth: Payer: Self-pay | Admitting: Neurology

## 2014-07-29 ENCOUNTER — Telehealth: Payer: Self-pay | Admitting: *Deleted

## 2014-07-29 NOTE — Telephone Encounter (Signed)
Pt called requesting to have an increase on her Topamax. C/B  [phone]

## 2014-07-29 NOTE — Telephone Encounter (Signed)
Increase topamax from 50mg  to 75mg  and to update in 4 weeks.

## 2014-07-29 NOTE — Telephone Encounter (Signed)
Topamax 75 mg  With 2 refills called to pharmacy patient is  Aware

## 2014-07-29 NOTE — Telephone Encounter (Signed)
Please advise on below note for med increase

## 2014-08-21 ENCOUNTER — Ambulatory Visit: Payer: Medicare Other | Admitting: Neurology

## 2014-08-21 ENCOUNTER — Telehealth: Payer: Self-pay | Admitting: Neurology

## 2014-08-21 NOTE — Telephone Encounter (Signed)
Pt cancelled appt for 08-21-14 due to not having a ride and resch for 09-06-14

## 2014-08-28 ENCOUNTER — Telehealth: Payer: Self-pay | Admitting: *Deleted

## 2014-08-28 NOTE — Telephone Encounter (Signed)
Patient requesting an increase in her topamax Call back [phone]

## 2014-08-29 ENCOUNTER — Other Ambulatory Visit: Payer: Self-pay

## 2014-08-29 MED ORDER — TIOTROPIUM BROMIDE MONOHYDRATE 18 MCG IN CAPS: 18.0000 ug | ORAL_CAPSULE | Freq: Every day | RESPIRATORY_TRACT | Status: AC

## 2014-09-05 ENCOUNTER — Telehealth: Payer: Self-pay | Admitting: *Deleted

## 2014-09-05 NOTE — Telephone Encounter (Signed)
Patient cancelled follow up appointment due to no transportation will call to reschedule later

## 2014-09-06 ENCOUNTER — Ambulatory Visit: Payer: Medicare Other | Admitting: Neurology

## 2014-09-12 ENCOUNTER — Telehealth: Payer: Self-pay | Admitting: *Deleted

## 2014-09-12 MED ORDER — VARENICLINE TARTRATE 1 MG PO TABS: 1.0000 mg | ORAL_TABLET | Freq: Two times a day (BID) | ORAL | Status: AC

## 2014-09-12 NOTE — Telephone Encounter (Signed)
OK to refill

## 2014-09-12 NOTE — Telephone Encounter (Signed)
Received fax pt is requesting refill on chantix 1 mg Take 1 tab BID #60 Last refilled 04/11/14 #60 x  1refill Last OV 04/16/14 No pending appt Please advise thanks

## 2014-09-12 NOTE — Telephone Encounter (Signed)
Refilled medication send to [facility] pharm. Nothing further needed.

## 2014-09-23 ENCOUNTER — Telehealth: Payer: Self-pay | Admitting: Neurology

## 2014-09-23 NOTE — Telephone Encounter (Signed)
Pt called requesting to speak to a nurse regarding increasing the dosage to her meds.  C/B [phone]

## 2014-09-23 NOTE — Telephone Encounter (Signed)
Spoke with patient. She is currently on Topamax 75 mg daily. She wants to increase dose. She is taking Ibuprofen two times a week. She states she does not have Diclofenac. No other medications for headaches. She states the headaches are all day, everyday. I tried to make appt for patient since she has missed her last two appts, but she states she does not have a ride to the office. Please advise.

## 2014-09-23 NOTE — Telephone Encounter (Signed)
I would increase Topamax to 100mg  daily.  She needs to follow up in a month.

## 2014-09-24 ENCOUNTER — Telehealth: Payer: Self-pay | Admitting: Neurology

## 2014-09-24 MED ORDER — TOPIRAMATE 100 MG PO TABS: 100.0000 mg | ORAL_TABLET | Freq: Every day | ORAL | Status: AC

## 2014-09-24 NOTE — Telephone Encounter (Signed)
Pt has called, she has questions regarding the prescription Dr. Everlena CooperJaffe has called in. CB# [phone] / Sherri S.

## 2014-09-24 NOTE — Telephone Encounter (Signed)
She was reportedly taking topamax 75mg  daily.  Therefore, I recommended increasing to 100mg  daily

## 2014-09-24 NOTE — Telephone Encounter (Signed)
Patient aware to increase to 100 mg Topamax. One month supply sent to pharmacy and patient made aware she has to be seen in a month. She will call back to schedule.

## 2014-09-24 NOTE — Telephone Encounter (Signed)
Jaffe pt 

## 2014-09-24 NOTE — Telephone Encounter (Signed)
Please clarify if patient should be taking 100 mg daily per new RX or 75 mg twice a day per patient. Please advise

## 2014-09-24 NOTE — Telephone Encounter (Signed)
Pt called f/u on the message from earlier today C/b [phone]

## 2014-09-25 NOTE — Telephone Encounter (Signed)
Pt called/returning your call at 11:50AM. C/B [phone]

## 2014-09-27 ENCOUNTER — Telehealth: Payer: Self-pay | Admitting: *Deleted

## 2014-09-27 NOTE — Telephone Encounter (Signed)
Topamax 100  mg 1 PO bid  total 200 mg daily called to pharmacy  with 1 refill patient is aware

## 2014-09-27 NOTE — Telephone Encounter (Signed)
Unable to reach patient have tried several time since our last conversation. Spoke with the pharmacy they stated  Darl PikesSusan called in a verbal order to increase to 75mg  twice daily. Please advise

## 2014-10-14 ENCOUNTER — Telehealth: Payer: Self-pay | Admitting: Pulmonary Disease

## 2014-10-14 NOTE — Telephone Encounter (Signed)
LMTCB

## 2014-10-15 NOTE — Telephone Encounter (Signed)
Spoke to [facility] at [phone].  Approval received for Chantix.  Case # [id number].  Effective date: 07/17/14 through 04/13/15.  LM for pt that med was approved.

## 2015-04-04 NOTE — H&P (Signed)
PATIENT NAME:  Jane Roberts, Jane Roberts MR#:  [id number] DATE OF BIRTH:  1966/01/15  DATE OF ADMISSION:  06/06/2013  PRIMARY CARE PHYSICIAN:  Unknown.   CHIEF COMPLAINT:  Confusion and shortness of breath.   HISTORY OF PRESENT ILLNESS:  A 49 year old Caucasian female patient with history of bipolar disorder, polysubstance abuse, seizures and tobacco abuse who presents to the Emergency Room brought in by her stepfather after he found her to be confused and short of breath. The patient spoke with her stepfather yesterday night asking him to get some cigarettes when she seemed fine to him over the phone. Today when he visited her, she was extremely weak, confused and brought her to the Emergency Room. She lives with her son who found her weak yesterday and lifted her to her bed and let her sleep at night. Presently, the patient seems to be waking up, but still is extremely confused.   Here in the Emergency Room, the patient was found to have a fever of 102 with saturations being around 80 to 90% on 6 liters oxygen and hypotensive in the 90s. Presently, the patient's blood pressure while I was in the room was 77/54 and on rechecking was 82/50. The patient is saturating 90% on 6 liters oxygen. Her chest x-ray shows possible bilateral basal infiltrates. CT scan of the head is negative.   PAST MEDICAL HISTORY:   1.  Seizure disorder.  2.  Bipolar.  3.  Anemia.  4.  Migraines.  5.  Chronic pain syndrome.  6.  History of past alcohol abuse.  7.  Cholecystectomy.  8.  Right C2 ganglionectomy.   SOCIAL HISTORY:  The patient lives with her son. Smokes a pack a day. Past alcohol abuse, but does not drink alcohol any more. No IV drug use.   CODE STATUS:  Full code.   ALLERGIES:  EFFEXOR, HALDOL, PENICILLIN, PROZAC, TORADOL.   FAMILY HISTORY:  Anxiety and depression in multiple family members.   REVIEW OF SYSTEMS:  Unobtainable secondary to the patient's confusion.   HOME MEDICATIONS:  Include:  1.  Gabapentin  800 mg oral 4 times a day.  2.  Latuda 60 mg oral once a day.  3.  Lorazepam 1 mg oral every 8 hours at bedtime for anxiety or sleep.  4.  Remeron 30 mg oral once a day at bedtime.  5.  Nexium 40 mg oral once a day for reflux.  6.  Zolpidem 10 mg oral once a day at bedtime as needed for insomnia.   PHYSICAL EXAMINATION: VITAL SIGNS: Temperature 102, pulse 100, blood pressure 77/54, saturating 90% on 6 liters oxygen, breathing 24 per minute.  GENERAL: Moderately built Caucasian female patient lying in bed, confused, restless.  HEENT: Atraumatic, normocephalic. Oral mucosa dry and pink. External ears and nose normal. No pallor. No icterus. Pupils bilaterally equal and reactive to light.  NECK: Supple. No thyromegaly. No palpable lymph nodes. Trachea midline. No carotid bruit, JVD.  CARDIOVASCULAR: S1, S2, tachycardic. No murmurs. No edema.  RESPIRATORY: Bilateral coarse breath sounds with wheezing. Decreased air entry.  GASTROINTESTINAL: Soft abdomen, nontender. Bowel sounds present. No hepatosplenomegaly palpable.  MUSCULOSKELETAL: No joint swelling, redness, effusion of the large joints. Normal muscle tone.  NEUROLOGICAL: Seems to move all extremities equally on both sides.  LYMPHATIC: No cervical lymphadenopathy.   DIAGNOSTIC AND LABORATORY DATA:   1.  Urine pregnancy test is negative. Glucose is 194, BUN 7, creatinine 1.18, sodium 130, potassium 2.6, chloride 96. GFR 56, magnesium 1.6,  alcohol less than 3, albumin 2.7. Urine drug screen shows positive for cannabinoids.  2.  WBC is 9.5, hemoglobin 15.1, platelets of 204. Urinalysis shows 3+ bacteria and 3 WBCs.  3.  ABG shows pH of 7.27, pCO2 of 58, pO2 of 68.  4.  Chest x-ray shows bilateral basilar infiltrates.   ASSESSMENT AND PLAN:   1.  Severe sepsis secondary to aspiration pneumonia. The patient will be admitted to Critical Care Unit with low blood pressure. We will bolus normal saline and aggressively fluid resuscitate. We will  watch her intake and output. We will place a Foley catheter in the patient.  2.  For the aspiration pneumonia, the patient will be started on meropenem and Levaquin. Send for blood and sputum cultures.  3.  Acute respiratory failure needing 6 liters oxygen secondary to aspiration pneumonia and chronic obstructive pulmonary disease exacerbation. The patient does have mild respiratory acidosis. In case she gets worse, she will need intubation. I discussed critical nature of illness with stepfather at bedside. Start Bipap to keep sats over 90% in setting of respiratory acidosis. Steroids, nebulizers and antibiotics.  4.  Acute encephalopathy secondary to carbon dioxide narcosis and infection. There is concern for overdose with Ambien. I asked her stepfather, but he is not sure. The patient seems to be slowly waking up. No benzodiazepines or opiates in urine drug screen. No alcohol in blood.  5.  Severe hypokalemia. Replace intravenously.  6.  Deep vein thrombosis prophylaxis with Lovenox.   CODE STATUS:  Full code.   TIME SPENT TODAY ON THIS CRITICALLY ILL PATIENT BEING ADMITTED TO CRITICAL CARE UNIT WITH ACUTE RESPIRATORY FAILURE AND SEVERE SEPSIS:  55 minutes.    ____________________________ Molinda BailiffSrikar R. Madhav Mohon, MD srs:si D: 06/06/2013 16:37:00 ET T: 06/06/2013 17:16:54 ET JOB#: [id number]  cc: Wardell HeathSrikar R. Elpidio AnisSudini, MD, <Dictator> Orie FishermanSRIKAR R Moshe Wenger MD ELECTRONICALLY SIGNED 06/06/2013 18:53

## 2015-04-04 NOTE — Discharge Summary (Signed)
PATIENT NAME:  Jane Roberts, Jane Roberts MR#:  [id number] DATE OF BIRTH:  [birth year]  DATE OF ADMISSION:  06/06/2013 DATE OF DISCHARGE:  06/15/2013  ADMITTING DIAGNOSIS: Confusion, shortness of breath.   DISCHARGE DIAGNOSES: 1.  Possible severe sepsis secondary to possible aspiration pneumonia as well as urinary tract infection.  2.  Acute respiratory failure requiring ventilator.  3.  Acute encephalopathy due to carbon dioxide narcosis and infection.  4.  Severe electrolyte imbalances, being replaced.  5.  Likely chronic obstructive pulmonary disease with acute exacerbation.  6.  Possible acute respiratory distress syndrome.  7.  Possible acute diastolic congestive heart failure with echo showing normal left ventricular function.   8.  Tobacco abuse.   CONSULTANT: Dr. Ned ClinesHerbon Fleming.   PERTINENT LABORATORY AND DIAGNOSTICS: Chest x-ray on the 25th showed shallow inspiration, on the 26th showed bibasilar atelectasis and pneumonia. CT scan of the head without contrast on the 25th was negative. CT of the chest with contrast on the 26th showed bilateral lower lobe pneumonia and trace pleural effusion, underlying emphysematous lung disease. 2-Roberts echo on the 29th showed normal LV function with EF of 55% to 60%, normal global left ventricular systolic function, undetermined diastolic dysfunction. No significant valvular abnormality. UA on admission showed 3+ bacteria, 3 WBCs. Blood cultures x 2 were negative. Urine culture grew more than 100,000 colonies of Klebsiella pneumoniae. Sputum grew yeast. Admitting WBC count 10.3, hemoglobin 10.3 and platelet count 180. LFTs showed AST of 47 and albumin of 2.4. BMP: Glucose 136, BUN 5, glucose 136, BUN 5, creatinine 0.86, sodium 136, potassium 3.7, chloride 107 and CO2 22. ABG: PH 7.23, pCO2 52 and pO2 57. Most recent chest x-ray showed mild interstitial opacities that are similar to prior. WBC count today is 14.9, hemoglobin 14.7 and platelet count 203. Her creatinine today  is 0.89. ABG with pH of 7.52, pCO2 43 and pO2 83.   HOSPITAL COURSE: Please refer to H and P done by the admitting physician. Please refer to interim summary done by Dr. Delfino LovettVipul Shah. The patient is a 49 year old white female with no history of CHF or diagnosis of COPD who presented to the ED after she was found to be confused and very short of breath. The patient in the ED was noted to have a fever of 102 with low blood pressure as well as sats 80 to 90% with 6 liters of O2. The patient was admitted with possible bilateral pneumonia with sepsis and acute respiratory failure.   IN ORDER OF HER PROBLEMS:  1.  Acute respiratory failure.  Was felt to be multifactorial, including due to possible bilateral pneumonia as well as possible COPD flare. Even though the patient has no diagnosis of COPD she had emphysematous changes on her CT. The patient also was thought to have possible ARDS and she was also noticed to have diastolic CHF. The patient was kept on the ventilator and is continued on ventilator.  Attempts at weaning have failed, at least 3 times. Her chest x-ray is showing improvement. Her ABG is showing improvement, but she will be difficult to wean off the ventilator, therefore, arrangements for [facility] are currently being made. 2.  Sepsis. Was felt to be due to a combination of possible pneumonia and UTI. The patient was treated and is continued on Levaquin. She has been afebrile.  3.  The patient was thought to have acute diastolic CHF. She is continued on IV Lasix with good urinary output and her respiratory status is improved.  4.  She also has diarrhea. Stool for C. diff was negative. She also had multiple electrolyte imbalances which have been corrected.   At this time, arrangements have been made for the patient to be transferred to [facility].    Ventilator settings at the time of transfer: She is on assist control rate of 14, tidal volume 450, FiO2 45% and PEEP of 8.   MEDICATIONS:  At the time  of transfer:  1.  Methylprednisolone 60 mg IV q. 8 hours. 2.  Tylenol 650 mg q. 4 hours p.r.n. for temperature greater 100.4.  3.  Lovenox 40 mg subcu daily.  4.  Lantus 10 units at bedtime. 5.  Sliding scale insulin. 6.  Chlorhexidine 5 mL orally q. 12 hours. 7.  Albuterol/Atrovent 8 puffs q. 4 hours. 8.  Dexmedetomidine IV as needed for sedation.  9.  Lasix 20 mg IV q. 12 hours. 10.  KCl 15 mL q. 8 hours. 11.  Mag oxide 400 mg 1 tab p.o. daily. 12.  Protonix 40 mg granules daily. 13.  Levofloxacin 750 mg IV q. 24 hours. 14.  Fluticasone inhaled via ventilator.   DIET:  She is on tube feeds with Vital 1.5 calories.   DISPOSITION: [facility].  DISCHARGE FOLLOWUP:  With primary MD once discharge from [facility].   TIME SPENT: An additional 45 minutes spent on the patient's transfer discharge in addition to the patient being evaluated in the morning. ____________________________ Lacie Scotts. Allena Katz, MD shp:sb Roberts: 06/15/2013 10:29:58 ET T: 06/15/2013 10:57:32 ET JOB#: [id number]  cc: Yarden Hillis H. Allena Katz, MD, <Dictator> Charise Carwin MD ELECTRONICALLY SIGNED 06/22/2013 14:46

## 2015-04-06 NOTE — Consult Note (Signed)
Brief Consult Note: Diagnosis: Bipolar affective disorder by history.   Patient was seen by consultant.   Comments: Pt intubated, unablwe to interview.  PLAN: 1. Will review her chart.  2. I will follow up.  Electronic Signatures: Kristine LineaPucilowska, Krista Som (MD)  (Signed 21-Jan-13 20:09)  Authored: Brief Consult Note   Last Updated: 21-Jan-13 20:09 by Kristine LineaPucilowska, Teancum Brule (MD)

## 2015-04-06 NOTE — Consult Note (Signed)
Brief Consult Note: Diagnosis: Bipolar affective disorder by history.   Comments: She sees Dr. Janeece RiggersSu. Last visit on December 02, 2011. Her current list of medication: Remeron 30 mg, Ambien 10 mg prn, Latuda 40 mg, BuSpar 60 mg, Neurontin 800 gid.   PLAN: I will follow up.  Electronic Signatures: Kristine LineaPucilowska, Jolanta (MD)  (Signed 22-Jan-13 11:59)  Authored: Brief Consult Note   Last Updated: 22-Jan-13 11:59 by Kristine LineaPucilowska, Jolanta (MD)

## 2015-04-06 NOTE — Op Note (Signed)
PATIENT NAME:  Jane Roberts, Jane Roberts MR#:  [id number] DATE OF BIRTH:  08/01/1966  DATE OF PROCEDURE:  01/02/2012  PREOPERATIVE DIAGNOSES:  1. Respiratory failure requiring intubation.  2. Lack of intravenous access.   POSTOPERATIVE DIAGNOSES:    1. Respiratory failure requiring intubation.  2. Lack of intravenous access.   PROCEDURE PERFORMED: Insertion of triple lumen catheter with ultrasound guidance.   SURGEON:  Renford DillsGregory G. Schnier, MD.  DESCRIPTION OF PROCEDURE: The patient is in the Intensive Care Unit critically ill, intubated and sedated. She is positioned supine. Right neck is prepped and draped in sterile fashion. Ultrasound is placed in sterile sleeve. Jugular vein is identified. It is echolucent, homogeneous, and easily compressible indicating patency. Under direct ultrasound visualization after recording an image, a Seldinger needle is inserted into the jugular vein. J-wire is advanced followed by a counter incision with an 11 blade scalpel. The dilator is passed over the wire. The triple-lumen catheter is inserted. All three lumens aspirate and flush easily. The catheter is then secured with 2-0 silk and a sterile dressing is applied. The patient tolerated the procedure without change.  ____________________________ Renford DillsGregory G. Schnier, MD ggs:ap Roberts: 01/02/2012 11:39:14 ET T: 01/02/2012 12:18:15 ET JOB#: [id number]  cc: Renford DillsGregory G. Schnier, MD, <Dictator> Renford DillsGREGORY G SCHNIER MD ELECTRONICALLY SIGNED 01/26/2012 11:24

## 2015-04-06 NOTE — Discharge Summary (Signed)
PATIENT NAME:  Jane Roberts, Jane Roberts MR#:  [id number] DATE OF BIRTH:  21-Jul-1966  DATE OF ADMISSION:  01/01/2012 DATE OF DISCHARGE:  0131/2013  ADMITTING PHYSICIAN: Enid Baas, MD   DISCHARGING PHYSICIAN: Corie Chiquito. Lafayette Dragon, MD   PRIMARY PSYCHIATRIST:  Lynett Fish, MD    PRIMARY CARE PHYSICIAN:  She has no primary care physician.   DISCHARGE DIAGNOSIS: Difficulty breathing.   DISCHARGE DIAGNOSES:  1. Acute respiratory failure, likely secondary to chronic obstructive pulmonary disease exacerbation.  2. Pneumonia. 3. Altered mental status secondary to carbon dioxide narcosis. 4. Sepsis. 5. Leukocytosis. 6. Bipolar disorder. 7. History of alcohol and polysubstance abuse.  8. Hyponatremia. 9. Hypokalemia.  10. Hypomagnesemia. 11. Hypophosphatemia.  12. Possible seizures.  13. Debilitation.    HISTORY AND PHYSICAL: Please refer to Dr. Hilbert Odor Interim Summary which covers the events from 01/19 to 01/09/2012. This covers from 01/28 to 01/13/2012.   PROCEDURES: No additional cardiac testing was done.   CONSULTANTS: None additional consultants were seen during the remaining portion of her hospitalization.   HOSPITAL COURSE: The patient is a 49 year old white female with past medical history of chronic obstructive pulmonary disease, polysubstance abuse, who presents with acute respiratory failure.   1. Acute hypercarbic respiratory failure: This was thought to be related to chronic obstructive pulmonary disease and underlying pneumonia. The patient was intubated. She received steroids, Levaquin, meropenem. She was extubated on 01/08/2012 and has been doing well. She has completed her 10-day course of Levaquin and meropenem. She was on IV steroids and now is weaned to steroid taper down to 20 mg of prednisone. She was also given nebulizers and Advair Diskus.  2. Chronic obstructive pulmonary disease: Likely secondary to possible underlying pneumonia. She was given high-dose steroids,  nebulizers, inhalers, and has been doing well now and is on oral steroids.  3. Altered mental status, possible seizures: This is most likely due to CO2 narcosis. She had EEG which showed generalized slowing but was on sedation at this point. No further seizure-type activity. We have switched her to p.o. Dilantin and weaned this off. She did have mildly elevated ammonia. She has no history of liver disease. She had an abdominal ultrasound which did not show any evidence of chronic liver disease. Her LFTs were normal. She was maintained on lactulose and Xifaxan, and her symptoms improved.  4. Leukocytosis: Most likely due to pneumonia and steroid use.  5. Hypertension: This was from sepsis and improved with her treatment of her pneumonia.  6. Bipolar disorder: The patient was seen by Psychiatry and continued on her Latuda, BuSpar, Neurontin, and Remeron.  7. Hyponatremia: This was likely secondary to dehydration and SIADH.  8. Hypokalemia, hypomagnesemia, hypophosphatemia: These were all repleted.  9. Alcohol abuse: The patient was on CIWA  protocol and counseled about substance abuse.  10. Smoking: The patient was counseled about cessation for over three minutes.  11. GI prophylaxis: Maintained with omeprazole.  12. Deep venous thrombosis prophylaxis: Maintained with Lovenox.   CODE STATUS:  FULL CODE.    CONDITION ON DISCHARGE: The patient was discharged on 01/13/2012. Temperature 98.7, heart rate 109, respiratory rate 20, blood pressure 136/82, sating 92% on 2 liters.    DISCHARGE MEDICATIONS: She will go home on the following medications:  1. BuSpar 15 mg p.o. b.i.d.  2. Gabapentin 800 mg p.o. q.i.d.  3. Latuda 40 mg at breakfast. 4. Mirtazapine 30 mg at bedtime.  5. Ambien 10 mg at bedtime for insomnia. 6. Tylenol 5 mg every 6 hours  p.r.n. headache.  7. Omeprazole 20 mg daily x 4 days 8. Prednisone 20 mg daily tiem 2 days then reduce by 10mg   for 2 days 9. Advair Diskus 250/50, 1 puff  b.i.d.  10. Albuterol-ipratropium SVN 3 mL every 4 hours.  11. Oxygen: 2 liters.   DIET: Regular diet.   ACTIVITY: With assistance. Physical therapy 5 times a week.   FOLLOWUP: The patient will need to see Jane Roberts upon discharge and see a primary care physician Jane Roberts 1 week  after discharge  Thank you for allowing me to participate in the care of this patient.   CODE STATUS:  FULL CODE.     TOTAL TIME SPENT ON DISCHARGE: 50 minutes.  ____________________________ Corie ChiquitoAmir A. Lafayette DragonFirozvi, MD aaf:cbb D: 01/13/2012 11:31:43 ET T: 01/13/2012 11:59:55 ET JOB#: [id number]  cc: Karolee OhsAmir A. Lafayette DragonFirozvi, MD, <Dictator> Lynett FishHansen Su, MD Jolanta B. Pucilowska, MD Hemang K. Sherryll BurgerShah, MD Herbon E. Meredeth IdeFleming, MD Coralyn PearAMIR A Jakob Kimberlin MD ELECTRONICALLY SIGNED 01/14/2012 12:02

## 2015-04-06 NOTE — H&P (Signed)
PATIENT NAME:  Jane Roberts, Jane Roberts MR#:  [id number] DATE OF BIRTH:  1966/07/04  DATE OF ADMISSION:  01/01/2012  ADMITTING PHYSICIAN: Dr. Enid Baasadhika Dariane Natzke  PRIMARY CARE PHYSICIAN: None.  PRIMARY PSYCHIATRIST: Dr. Janeece RiggersSu    CHIEF COMPLAINT: Breathing difficulty.   HISTORY OF PRESENT ILLNESS: Jane Roberts is a 49 year old Caucasian female with past medical history significant for bipolar disorder versus substance induced mood disorder, panic disorder, benzodiazepine dependence at one point, depression and anxiety comes to the hospital complaining of difficulty breathing for the last couple of days. Patient is currently on a BiPAP and unable to provide any history. Her son who is her legal guardian is at bedside and gave most of the history. According to him, patient has been sick for the last couple of days with coughing and also breathing difficulty. Last night she could not lay flat at all because of her breathing difficulties. She denied any chest pain, abdominal pain. No nausea or vomiting. She felt cold with chills this morning but no fevers. She also appears very confused and appeared to be in severe distress so she was brought to the hospital. She was hypoxic with sats of 76% on room air, she does not wear any home oxygen. She is also tachycardic, heart rate of 112 and chest x-ray shows bibasilar infiltrates.   Patient placed on BiPAP in the ED, her initial ABG showed pH 7.19, pCO2 72, oxygen 112 on 75% FiO2 and sats 97%. She will be admitted to Intensive Care Unit for acute respiratory failure.   PAST MEDICAL HISTORY:  1. Bipolar with depression and anxiety.  2. Substance-induced mood disorder.  3. Panic disorder with agoraphobia.  4. Migraine.  5. 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 6. History of hypokalemia.  7. History of anemia.   PAST SURGICAL HISTORY:  1. Cholecystectomy.  2. C2 ganglionectomy for migraine headaches.   ALLERGIES: She is allergic to  Effexor, Haldol, Prozac, Vioxx, penicillin, Toradol and Talwin.   CURRENT MEDICATIONS AT HOME:  1. Remeron 30 mg at bedtime.  2. Latuda 40 mg p.o. daily.  3. Zaleplon 10 mg p.o. daily for insomnia at bedtime.  4. BuSpar 15 mg p.o. b.i.Roberts.  5. Ambien 10 mg p.o. at bedtime.  6. Gabapentin 800 mg p.o. t.i.Roberts.   SOCIAL HISTORY: Living at home with son who is her legal power of attorney. Smokes about 1 to 1.5 packs per day. No history of any alcohol use.   FAMILY HISTORY: Mom with chronic obstructive pulmonary disease and also passed away from pneumonia. Brother with bipolar disorder.   REVIEW OF SYSTEMS: Difficult to be obtained secondary to patient's mental status. She appears drowsy, confused currently on BiPAP.   PHYSICAL EXAMINATION:  VITAL SIGNS: Temperature 97.3 degrees Fahrenheit, pulse 112, respirations 32, blood pressure 142/80, pulse oximetry 96% on room air and 94% on 80% FiO2 on the BiPAP.   GENERAL: Well built, well nourished female lying in bed in moderate respiratory distress on BiPAP.   HEENT: Normocephalic. Pupils are 4 mm dilated, reacting to light bilaterally. Extraocular movements are intact. Oropharynx clear without erythema, mass, or exudates.   NECK: Supple. No thyromegaly, JVD, or carotid bruits. No lymphadenopathy.   LUNGS: Distant breath sounds bilaterally with bibasilar rhonchi. No use of accessory muscles for breathing.   CARDIOVASCULAR: S1, S2 regular rate and rhythm. No murmurs, rubs, or gallops.   ABDOMEN: Soft, nontender, nondistended. No hepatosplenomegaly. Normal bowel sounds.   EXTREMITIES: No pedal edema. No clubbing or cyanosis.  2+ dorsalis pedis pulses palpable bilaterally.   SKIN: No acne, rash, or lesions. Presence of hyperpigmented patches are present on face.   NEUROLOGIC: Patient is very sleepy on the BiPAP. According to the RN she was awake a few minutes ago. She was able to move all four extremities and following simple commands without  opening her eyes.   PSYCH: Unable to assess as she is drowsy.   LABORATORY, DIAGNOSTIC AND RADIOLOGICAL DATA:  WBCs 17.8, hemoglobin 15.7, hematocrit 47.4, platelet count 292.   Sodium 135, potassium 3.1, chloride 97, bicarbonate 25, BUN 4, creatinine 1.04, glucose 145, calcium 8.6. Troponin less than 0.02. BNP is elevated at 1639.   ABG showing pH 7.19, pCO2 17, pO2 112, bicarbonate 26 and sats 97% on 75% FiO2. Chest x-ray showing cardiomegaly is present, pulmonary vascular prominence and interstitial prominence present. Pneumonitis cannot be excluded.   EKG showing sinus tachycardia, heart rate 120.  ASSESSMENT AND PLAN: 49 year old female with history of bipolar disorder with depression and anxiety and also history of chronic obstructive pulmonary disease admitted for acute respiratory failure secondary to pneumonia and chronic obstructive pulmonary disease exacerbation, also found to be in respiratory acidosis.  1. Acute respiratory failure likely secondary to chronic obstructive pulmonary disease exacerbation and pneumonia/pneumonitis. She is currently on BiPAP with respiratory acidosis, hypoxia and hypercarbia. Currently on 80% FiO2. Will repeat another ABG if situation worsens then probably needs to be intubated. Will admit the patient to Intensive Care Unit with her mental status being borderline. Also discussed with son at bedside who is the legal guardian and he agrees with the current plan. Will get a pulmonary consult. Repeat chest x-ray in a.m. Blood cultures are drawn and start antibiotics with Levaquin and Zosyn currently.  2. Altered mental status secondary to carbon dioxide narcosis. Currently on BiPAP. Will get another ABG. Also will check an ammonia level.  3. Respiratory acidosis. Again will follow up repeat ABG to see if it is better on BiPAP. If it gets worse then probably need to be intubated.  4. Bipolar with depression, anxiety, insomnia. Will hold all her psych medications  secondary to her altered mental status now. Patient follows with Dr. Janeece Riggers as an outpatient.  5. Hypokalemia. Replaced with 40 mEq of KCl and follow up. She will be on CCU electrolyte replacement protocol.  6. Tobacco use disorder. Difficult to counsel her secondary to her mental status so needs to be counseled prior to discharge.  7. GI and deep vein thrombosis prophylaxis. Will place on Protonix and also Lovenox.  8. CODE STATUS: FULL CODE.   CRITICAL CARE TIME SPENT ON ADMISSION: 60 minutes.   ____________________________ Enid Baas, MD rk:cms Roberts: 01/01/2012 14:36:12 ET T: 01/01/2012 14:57:11 ET JOB#: [id number]  cc: Enid Baas, MD, <Dictator> Lynett Fish, MD Enid Baas MD ELECTRONICALLY SIGNED 01/01/2012 20:01

## 2015-04-06 NOTE — Consult Note (Signed)
General Aspect iv access    Present Illness The patient is a 49 year old female with past medical history significant for bipolar disorder, panic disorder, benzodiazepine dependence, depression and anxiety comes to the hospital complaining of difficulty breathing for the past several days. Patient is currently intubated and unable to provide any history. The patient has been sick for the last couple of days with coughing and also breathing difficulty. She denied any chest pain, abdominal pain. No nausea or vomiting. She was brought to the hospital. She was hypoxic with sats of 76% on room air, she does not wear any home oxygen. She is also tachycardic, heart rate of 112 and chest x-ray shows bibasilar infiltrates.   Patient placed on BiPAP in the ED, her initial ABG showed pH 7.19, pCO2 72, oxygen 112 on 75% FiO2 and sats 97%. She was admitted to Intensive Care Unit for acute respiratory failure and continued to deteriorate and was intubated.  She is requiring multiple parenteral medications to sustain her life but does not have adequate IV access despite multiple attempts.  I am asked to evaluate.   PAST MEDICAL HISTORY:  1. Bipolar with depression and anxiety.  2. Substance-induced mood disorder.  3. Panic disorder with agoraphobia.  4. Migraine.  5. 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 6. History of hypokalemia.  7. History of anemia.   PAST SURGICAL HISTORY:  1. Cholecystectomy.  2. C2 ganglionectomy for migraine headaches.   Home Medications:  zaleplon 10 mg oral capsule: 1 cap(s) orally once a day (at bedtime), Active  gabapentin 800 mg oral tablet: 1 tab(s) orally 3 times a day, Active  Ambien 10 mg oral tablet: 1 tab(s) orally once a day (at bedtime), Active  mirtazapine 30 mg oral tablet, disintegrating: 1 tab(s) orally once a day (at bedtime), Active  Latuda 40 mg oral tablet: 1 tab(s) orally once a day, Active  BuSpar: 15 milligram(s) orally  2 times a day, Active   Effexor: Alt Ment Status  Haldol: Alt Ment Status, Other  Vioxx: Itching, Rash  PCN: Itching, Rash  Toradol: Other  Talwin: Unknown  Other- Explain in Comments Line: Unknown  Prozac: Unknown  Case History:   Social History positive  tobacco, positive ETOH, positive Illicit drugs   Review of Systems:   ROS Pt not able to provide ROS   Physical Exam:   GEN WD, critically ill appearing    HEENT moist oral mucosa, good dentition    NECK No masses  trachea midline    RESP intubated ventilated    CARD regular rate  no JVD    ABD denies tenderness  denies Flank Tenderness    EXTR negative cyanosis/clubbing, negative edema    SKIN No rashes, No ulcers    PSYCH sedated, intubated   Nursing/Ancillary Notes: **Vital Signs.:   20-Jan-13 09:00   Pulse Pulse 60   Respirations Respirations 19   Systolic BP Systolic BP 174   Diastolic BP (mmHg) Diastolic BP (mmHg) 64   Mean BP 77   Pulse Ox % Pulse Ox % 95   Pulse Ox Activity Level  At rest   Oxygen Delivery Ventilator Assisted   Pulse Ox Heart Rate 60   Routine Hem:  19-Jan-13 12:30    WBC (CBC) 17.8   RBC (CBC) 4.81   Hemoglobin (CBC) 15.7   Hematocrit (CBC) 47.4   Platelet Count (CBC) 292   MCV 99   MCH 32.7   MCHC 33.2  RDW 15.2  Routine Chem:  19-Jan-13 12:30    Glucose, Serum 145   BUN 4   Creatinine (comp) 1.04   Sodium, Serum 135   Potassium, Serum 3.1   Chloride, Serum 97   CO2, Serum 25   Calcium (Total), Serum 8.6   Anion Gap 13   Osmolality (calc) 270   eGFR (African American) >60   eGFR (Non-African American) >60  Cardiac:  19-Jan-13 12:30    Troponin I < 0.02  Routine Chem:  19-Jan-13 12:30    B-Type Natriuretic Peptide [facility]) 1639  Cardiac:  19-Jan-13 12:30    CK, Total 151   CPK-MB, Serum 5.1  Routine UA:  19-Jan-13 12:33    Color (UA) Yellow   Clarity (UA) Hazy   Glucose (UA) Negative   Bilirubin (UA) Negative   Ketones (UA) Negative   Specific  Gravity (UA) 1.021   Blood (UA) 1+   pH (UA) 6.0   Nitrite (UA) Negative   Leukocyte Esterase (UA) Negative   RBC (UA) 1 /HPF   WBC (UA) 1 /HPF   Mucous (UA) PRESENT   Hyaline Cast (UA) 1 /LPF  Lab:  19-Jan-13 13:20    pH (ABG) 7.19   PCO2 70   PO2 112   FiO2 75   Base Excess -3.6   HCO3 26.7   O2 Saturation 97.2   Patient Temp (ABG) 37.0    15:00    pH (ABG) 7.16   PCO2 74   PO2 81   FiO2 80   Base Excess -4.5   HCO3 26.4   O2 Saturation 92.0   Patient Temp (ABG) 37.0  Urine Drugs:  19-Jan-13 17:08    Amphetamines, Urine Qual. NEGATIVE   Cocaine Metabolite, Urine Qual. NEGATIVE   Cannabinoid, Urine Qual. POSITIVE   Barbiturates, Urine Qual. NEGATIVE   Benzodiazepine, Urine Qual. NEGATIVE   Opiate, Urine qual NEGATIVE  Routine Chem:  19-Jan-13 17:10    Ammonia, Plasma 118  Lab:  19-Jan-13 17:45    pH (ABG) 7.25   PCO2 51   PO2 70   FiO2 70   Base Excess -5.2   HCO3 22.4   O2 Saturation 90.6   Patient Temp (ABG) 37.0   O2 Device 840   Specimen Type (ABG) ARTERIAL   Vt 450   PEEP 5.0   Mechanical Rate 16  Routine Chem:  19-Jan-13 21:08    Potassium, Serum 4.0  Cardiac:  19-Jan-13 21:08    Troponin I < 0.02   CK, Total 112   CPK-MB, Serum 3.0   Radiology Results: Lab:    19-Jan-13 13:20, ABG   pH (ABG) 7.19   PCO2 70   PO2 112   FiO2 75   Base Excess -3.6   HCO3 26.7   O2 Saturation 97.2   Specimen Site (ABG)    RT RADIAL   Patient Temp (ABG) 37.0     Impression 1.  Lack of IV access           will plan right IJ catheter with [place] 2.  Pneumonia           continue intubation           plan per critical care 3.  Bipolar           plan per physc    Plan level 3   Electronic Signatures: Hortencia Pilar (MD)  (Signed 20-Jan-13 11:37)  Authored: General Aspect/Present Illness, Home Medications, Allergies, History and Physical Exam, Vital  Signs, Labs, Radiology, Impression/Plan   Last Updated: 20-Jan-13 11:37 by Hortencia Pilar (MD)

## 2015-07-03 ENCOUNTER — Telehealth: Payer: Self-pay | Admitting: *Deleted

## 2015-07-03 NOTE — Telephone Encounter (Signed)
Letter mailed out to make patient aware of Dr. Ulyses Jarred clinic moving to [facility] was returned. We need an updated address. Pt make continue seeing Dr. Kendrick Fries in [place] or she may be seen by one of our new doctor in the [place] office.
